# Patient Record
Sex: Male | Born: 1935 | Race: White | Hispanic: No | Smoking: Former smoker
Health system: Southern US, Community
[De-identification: ages and names within clinical notes are randomized; demographics above are authoritative.]

## PROBLEM LIST (undated history)

## (undated) DIAGNOSIS — E785 Hyperlipidemia, unspecified: Secondary | ICD-10-CM

## (undated) HISTORY — PX: HERNIA REPAIR: SHX51

---

## 2000-04-27 ENCOUNTER — Ambulatory Visit (HOSPITAL_COMMUNITY): Admission: RE | Admit: 2000-04-27 | Discharge: 2000-04-27 | Payer: Self-pay | Admitting: Ophthalmology

## 2006-07-04 ENCOUNTER — Ambulatory Visit: Payer: Self-pay | Admitting: Internal Medicine

## 2006-07-18 ENCOUNTER — Ambulatory Visit: Payer: Self-pay | Admitting: Internal Medicine

## 2011-10-11 DIAGNOSIS — Z125 Encounter for screening for malignant neoplasm of prostate: Secondary | ICD-10-CM | POA: Diagnosis not present

## 2011-10-11 DIAGNOSIS — I10 Essential (primary) hypertension: Secondary | ICD-10-CM | POA: Diagnosis not present

## 2011-10-11 DIAGNOSIS — R7301 Impaired fasting glucose: Secondary | ICD-10-CM | POA: Diagnosis not present

## 2011-10-11 DIAGNOSIS — R82998 Other abnormal findings in urine: Secondary | ICD-10-CM | POA: Diagnosis not present

## 2011-10-11 DIAGNOSIS — G250 Essential tremor: Secondary | ICD-10-CM | POA: Diagnosis not present

## 2011-10-18 DIAGNOSIS — I10 Essential (primary) hypertension: Secondary | ICD-10-CM | POA: Diagnosis not present

## 2011-10-18 DIAGNOSIS — Z0000 Encounter for general adult medical examination without abnormal findings: Secondary | ICD-10-CM | POA: Diagnosis not present

## 2011-10-18 DIAGNOSIS — J449 Chronic obstructive pulmonary disease, unspecified: Secondary | ICD-10-CM | POA: Diagnosis not present

## 2011-10-18 DIAGNOSIS — Z125 Encounter for screening for malignant neoplasm of prostate: Secondary | ICD-10-CM | POA: Diagnosis not present

## 2011-10-19 DIAGNOSIS — Z1212 Encounter for screening for malignant neoplasm of rectum: Secondary | ICD-10-CM | POA: Diagnosis not present

## 2011-12-26 DIAGNOSIS — H35059 Retinal neovascularization, unspecified, unspecified eye: Secondary | ICD-10-CM | POA: Diagnosis not present

## 2011-12-26 DIAGNOSIS — H3530 Unspecified macular degeneration: Secondary | ICD-10-CM | POA: Diagnosis not present

## 2011-12-26 DIAGNOSIS — H353290 Exudative age-related macular degeneration, unspecified eye, stage unspecified: Secondary | ICD-10-CM | POA: Diagnosis not present

## 2012-02-05 DIAGNOSIS — J449 Chronic obstructive pulmonary disease, unspecified: Secondary | ICD-10-CM | POA: Diagnosis not present

## 2012-02-05 DIAGNOSIS — R030 Elevated blood-pressure reading, without diagnosis of hypertension: Secondary | ICD-10-CM | POA: Diagnosis not present

## 2012-02-05 DIAGNOSIS — R05 Cough: Secondary | ICD-10-CM | POA: Diagnosis not present

## 2012-03-21 DIAGNOSIS — H3530 Unspecified macular degeneration: Secondary | ICD-10-CM | POA: Diagnosis not present

## 2012-03-21 DIAGNOSIS — H35059 Retinal neovascularization, unspecified, unspecified eye: Secondary | ICD-10-CM | POA: Diagnosis not present

## 2012-03-21 DIAGNOSIS — H3320 Serous retinal detachment, unspecified eye: Secondary | ICD-10-CM | POA: Diagnosis not present

## 2012-03-21 DIAGNOSIS — H33009 Unspecified retinal detachment with retinal break, unspecified eye: Secondary | ICD-10-CM | POA: Diagnosis not present

## 2012-03-21 DIAGNOSIS — H353290 Exudative age-related macular degeneration, unspecified eye, stage unspecified: Secondary | ICD-10-CM | POA: Diagnosis not present

## 2012-03-26 DIAGNOSIS — H3530 Unspecified macular degeneration: Secondary | ICD-10-CM | POA: Diagnosis not present

## 2012-03-26 DIAGNOSIS — H3320 Serous retinal detachment, unspecified eye: Secondary | ICD-10-CM | POA: Diagnosis not present

## 2012-03-28 DIAGNOSIS — H3530 Unspecified macular degeneration: Secondary | ICD-10-CM | POA: Diagnosis not present

## 2012-03-28 DIAGNOSIS — H3320 Serous retinal detachment, unspecified eye: Secondary | ICD-10-CM | POA: Diagnosis not present

## 2012-06-15 ENCOUNTER — Encounter (HOSPITAL_COMMUNITY): Payer: Self-pay

## 2012-06-15 ENCOUNTER — Emergency Department (INDEPENDENT_AMBULATORY_CARE_PROVIDER_SITE_OTHER)
Admission: EM | Admit: 2012-06-15 | Discharge: 2012-06-15 | Disposition: A | Discharge status: Home / Self Care | Payer: Medicare Other | Source: Home / Self Care | Attending: Emergency Medicine | Admitting: Emergency Medicine

## 2012-06-15 DIAGNOSIS — M545 Low back pain: Secondary | ICD-10-CM

## 2012-06-15 LAB — POCT URINALYSIS DIP (DEVICE)
Glucose, UA: NEGATIVE mg/dL
Ketones, ur: NEGATIVE mg/dL
Leukocytes, UA: NEGATIVE
Protein, ur: NEGATIVE mg/dL
Urobilinogen, UA: 0.2 mg/dL (ref 0.0–1.0)

## 2012-06-15 MED ORDER — METHOCARBAMOL 500 MG PO TABS: 500.0000 mg | ORAL_TABLET | Freq: Three times a day (TID) | ORAL | Status: AC

## 2012-06-15 MED ORDER — TRAMADOL HCL 50 MG PO TABS: 100.0000 mg | ORAL_TABLET | Freq: Three times a day (TID) | ORAL | Status: AC | PRN

## 2012-06-15 NOTE — ED Provider Notes (Signed)
Chief Complaint  Patient presents with  . Back Pain    History of Present Illness:   [name] is an 78 year old male who has had a four-day history of lower back pain. This is bilateral and without radiation. He denies any injury to his back. It seems to be getting worse. It's worse with bending, twisting, with movement. He denies any radiation down the legs, numbness, tingling, or muscle weakness. He's had no bladder or bowel dysfunction. No abdominal pain, fever, chills, chest pain, shortness of breath, or dizziness. He's never had pain like this before. He called his primary care physician and the on-call doctor recommended he go to the emergency room to rule out an aneurysm. He came here instead.  Review of Systems:  Other than noted above, the patient denies any of the following symptoms: Systemic:  No fever, chills, severe fatigue, or unexplained weight loss. GI:  No abdominal pain, nausea, vomiting, diarrhea, constipation, incontinence of bowel, or blood in stool. GU:  No dysuria, frequency, urgency, or hematuria. No incontinence of urine or difficulty urinating.  M-S:  No neck pain, joint pain, arthritis, or myalgias. Neuro:  No paresthesias, saddle anesthesia, muscular weakness, or progressive neurological deficit.  PMFSH:  Past medical history, family history, social history, meds, and allergies were reviewed. Specifically, there is no history of cancer, major trauma, osteoporosis, immunosuppression, HIV, or IV or injection drug use.   Physical Exam:   Vital signs:  BP 225/92  Pulse 80  Temp 98.1 F (36.7 C) (Oral)  Resp 18  SpO2 96% General:  Alert, oriented, in no distress. Abdomen:  Soft, non-tender.  No organomegaly or mass.  No pulsatile midline abdominal mass or bruit. Back:  There was moderate pain to palpation in the lower back over the paravertebral muscles. His back had 60 of flexion with pain, 10 of extension, 10 of lateral bending, and 30 of rotation all with  pain. Straight leg raising was positive for the right leg and negative in the left. He has a negative Lasegue's sign and negative popliteal compression sign. Neuro:  Normal muscle strength, sensations and DTRs. Extremities: Pedal pulses were full, he has slight pedal edema.. Skin:  Clear, warm and dry.  No rash.  Labs:   Results for orders placed during the hospital encounter of 06/15/12  POCT URINALYSIS DIP (DEVICE)      Component Value Range   Glucose, UA NEGATIVE  NEGATIVE mg/dL   Bilirubin Urine NEGATIVE  NEGATIVE   Ketones, ur NEGATIVE  NEGATIVE mg/dL   Specific Gravity, Urine <=1.005  1.005 - 1.030   Hgb urine dipstick NEGATIVE  NEGATIVE   pH 5.5  5.0 - 8.0   Protein, ur NEGATIVE  NEGATIVE mg/dL   Urobilinogen, UA 0.2  0.0 - 1.0 mg/dL   Nitrite NEGATIVE  NEGATIVE   Leukocytes, UA NEGATIVE  NEGATIVE    Assessment:  The encounter diagnosis was Low back pain.  Plan:   1.  The following meds were prescribed:   New Prescriptions   METHOCARBAMOL (ROBAXIN) 500 MG TABLET    Take 1 tablet (500 mg total) by mouth 3 (three) times daily.   TRAMADOL (ULTRAM) 50 MG TABLET    Take 2 tablets (100 mg total) by mouth every 8 (eight) hours as needed for pain.   2.  The patient was instructed in symptomatic care and handouts were given. 3.  The patient was told to return if becoming worse in any way, if no better in 2 weeks, and  given some red flag symptoms that would indicate earlier return. 4.  The patient was encouraged to try to be as active as possible and given some exercises to do followed by moist heat. I told him that there was no tests that [facility] could [facility] that would rule out an aneurysm. I offered to have him transferred to the emergency department for further testing, but he declined. He was admonished that he he should get worse or the pain hadn't improved in 24 hours to return to the emergency room for recheck.    Reuben Likes, MD 06/15/12 2031

## 2012-06-15 NOTE — ED Notes (Signed)
Pt has low back pain that started on Wed, no known injury.  He played golf on Thursday and took hydrocodone and has gradually worsened.  Denies leg pain.

## 2012-06-17 DIAGNOSIS — M545 Low back pain: Secondary | ICD-10-CM | POA: Diagnosis not present

## 2012-06-21 ENCOUNTER — Ambulatory Visit (HOSPITAL_COMMUNITY)
Admission: RE | Admit: 2012-06-21 | Discharge: 2012-06-21 | Disposition: A | Discharge status: Home / Self Care | Payer: Medicare Other | Source: Ambulatory Visit | Attending: Orthopedic Surgery | Admitting: Orthopedic Surgery

## 2012-06-21 DIAGNOSIS — M6281 Muscle weakness (generalized): Secondary | ICD-10-CM | POA: Insufficient documentation

## 2012-06-21 DIAGNOSIS — IMO0001 Reserved for inherently not codable concepts without codable children: Secondary | ICD-10-CM | POA: Insufficient documentation

## 2012-06-21 DIAGNOSIS — R262 Difficulty in walking, not elsewhere classified: Secondary | ICD-10-CM | POA: Insufficient documentation

## 2012-06-21 DIAGNOSIS — M2560 Stiffness of unspecified joint, not elsewhere classified: Secondary | ICD-10-CM | POA: Insufficient documentation

## 2012-06-21 DIAGNOSIS — M542 Cervicalgia: Secondary | ICD-10-CM | POA: Diagnosis not present

## 2012-06-21 DIAGNOSIS — M549 Dorsalgia, unspecified: Secondary | ICD-10-CM | POA: Diagnosis not present

## 2012-06-21 NOTE — Evaluation (Signed)
Physical Therapy Evaluation  Patient Details  Name: [name] MRN: [id number] Date of Birth: 03/26/1924  Today's Date: 06/21/2012 Time: 1191-4782 PT Time Calculation (min): 47 min  Visit#: 1  of 6   Re-eval: 07/12/12 Assessment Diagnosis: Low back pain  Authorization: medicare  Authorization Time Period:    Authorization Visit#: 1  of 10    Past Medical History: No past medical history on file. Past Surgical History: No past surgical history on file.  Subjective Symptoms/Limitations Symptoms: [name] states that he has has low back pain for for about ten days.  The  pt states there was not injury or trauma.  He took tylenol and went on about his day.  The next day the pain was greater; he took some hydrocodone but the next day was even worse. His pain has been progrressive since then.  The patient states that the pain is more on the right side than the left.  The pain is staying in his lower back area.   How long can you sit comfortably?: The patient states that he is feels like he has to get up and move around after 15 minutes.  How long can you stand comfortably?: The patient states that he is able to stand but he favors his position. How long can you walk comfortably?: The patient states that he is able to walk five minutes  Pain Assessment Currently in Pain?: Yes Pain Score:   7 (with pain meds.) Pain Location: Back Pain Orientation: Lower Pain Type: Acute pain Pain Relieving Factors: pain meds.    Prior Functioning  Home Living Lives With: Spouse Home Access: Stairs to enter (5; pt states that this is difficult for the patient.) Prior Function Vocation: On disability Leisure: Hobbies-yes (Comment) Comments: golfing  Cognition/Observation Cognition Overall Cognitive Status: Appears within functional limits for tasks assessed  Sensation/Coordination/Flexibility/Functional Tests Functional Tests Functional Tests: back oswestry 62/100  Assessment Lumbar  AROM Lumbar Flexion: decreased 70 % Lumbar Extension: increases pain  decreased 80% Lumbar - Right Side Bend: increases pain decreased 60% Lumbar - Left Side Bend: decreased 50% Lumbar - Right Rotation: decreased 30% Lumbar - Left Rotation: decreased 30%  Exercise/Treatments Mobility/Balance  Posture/Postural Control Posture/Postural Control: Postural limitations Postural Limitations: increased kyphosis; decreased lordosis   Stretches Active Hamstring Stretch: 3 reps;30 seconds Single Knee to Chest Stretch: 5 reps;20 seconds Lower Trunk Rotation: 5 reps Supine Ab Set: 10 reps    Physical Therapy Assessment and Plan PT Assessment and Plan Clinical Impression Statement: Pt with signs of instability who will benefit from skilled therapy to incrase core strength and imporve flexibility to decrease pain Pt will benefit from skilled therapeutic intervention in order to improve on the following deficits: Decreased range of motion;Decreased strength;Difficulty walking;Impaired flexibility;Pain Rehab Potential: Good PT Frequency: Min 2X/week PT Duration:  (3 weeks) PT Treatment/Interventions: Therapeutic exercise;Modalities;Patient/family education;Therapeutic activities PT Plan: begin bent knee raise, bridges, hip isometric and clam next treatment as well as adding in massage, 3rd treatment begin  SL abduction; prone heel squeeze, SLR; 4th treatment begin T-band ex.     Goals Home Exercise Program Pt will Perform Home Exercise Program: Independently PT Short Term Goals Time to Complete Short Term Goals: 2 weeks PT Short Term Goal 1: Pt pain to be decreased by 3 levels PT Short Term Goal 2: Pt ROM to be improved by 50% PT Long Term Goals Time to Complete Long Term Goals:  (3 weeks) PT Long Term Goal 1: I in advance HEP PT  Long Term Goal 2: Pain to be no greater than a 2 80% of the day without pain meds Long Term Goal 3: ROM wnl Long Term Goal 4: Pt oswestry to decrase by at least  10 points PT Long Term Goal 5: Pt to begin golfing at range  Problem List Patient Active Problem List  Diagnosis  . Difficulty in walking  . Stiffness of vertebral column    PT - End of Session Activity Tolerance: Patient tolerated treatment well General Behavior During Session: [facility] for tasks performed Cognition: [facility] for tasks performed PT Plan of Care PT Home Exercise Plan: given Consulted and Agree with Plan of Care: Patient  GP Functional Assessment Tool Used: LEFS Functional Limitation: Mobility: Walking and moving around Mobility: Walking and Moving Around Current Status ([id number]): At least 60 percent but less than 80 percent impaired, limited or restricted Mobility: Walking and Moving Around Goal Status [phone]): At least 1 percent but less than 20 percent impaired, limited or restricted  Diaz,Nicholas 06/21/2012, 10:47 AM  Physician Documentation Your signature is required to indicate approval of the treatment plan as stated above.  Please sign and either send electronically or make a copy of this report for your files and return this physician signed original.   Please mark one 1.__approve of plan  2. ___approve of plan with the following conditions.   ______________________________                                                          _____________________ Physician Signature                                                                                                             Date

## 2012-06-25 ENCOUNTER — Ambulatory Visit (HOSPITAL_COMMUNITY)
Admission: RE | Admit: 2012-06-25 | Discharge: 2012-06-25 | Disposition: A | Discharge status: Home / Self Care | Payer: Medicare Other | Source: Ambulatory Visit | Attending: Internal Medicine | Admitting: Internal Medicine

## 2012-06-25 DIAGNOSIS — M549 Dorsalgia, unspecified: Secondary | ICD-10-CM | POA: Diagnosis not present

## 2012-06-25 DIAGNOSIS — M542 Cervicalgia: Secondary | ICD-10-CM | POA: Diagnosis not present

## 2012-06-25 DIAGNOSIS — M6281 Muscle weakness (generalized): Secondary | ICD-10-CM | POA: Diagnosis not present

## 2012-06-25 DIAGNOSIS — IMO0001 Reserved for inherently not codable concepts without codable children: Secondary | ICD-10-CM | POA: Diagnosis not present

## 2012-06-25 NOTE — Progress Notes (Signed)
Physical Therapy Treatment Patient Details  Name: Nicholas Diaz MRN: [id number] Date of Birth: [birth year]  Today's Date: 06/25/2012 Time: 1478-2956 PT Time Calculation (min): 46 min  Visit#: 2  of 6   Re-eval: 07/12/12 Charges: Therex x 34' Manual x 10'  Authorization: medicare  Authorization Visit#: 2  of 10    Subjective: Symptoms/Limitations Symptoms: Pt states that his pain is gradually getting better. Pain Assessment Currently in Pain?: Yes Pain Score:   5 Pain Location: Back Pain Orientation: Lower   Exercise/Treatments Stretches Active Hamstring Stretch: 3 reps;30 seconds Single Knee to Chest Stretch: 3 reps;30 seconds Lower Trunk Rotation: 5 reps;10 seconds    Supine Ab Set: 10 reps Bent Knee Raise: 5 reps Bridge: 10 reps Straight Leg Raise: 5 reps Sidelying Clam: 5 reps;Limitations Clam Limitations: 10" holds  Manual Therapy Manual Therapy: Massage Massage: STM completed to B lower lumbar mm to decrease tightness/pain x10'  Physical Therapy Assessment and Plan PT Assessment and Plan Clinical Impression Statement: Pt completes therex well with minimal need for cueing after initial instruction. Pt does require multimodal cueing to avoid hamstring contraction with SL clams. STM completed to B lower lumbar mm to decrease tightness/pain. Pt reports pain decrease to 2/10 at end of session. PT Duration:  (3 weeks) PT Plan: Continue to progress per PT POC. Begin SL abduction, prone heel squeeze, and prone SLR next session.     Problem List Patient Active Problem List  Diagnosis  . Difficulty in walking  . Stiffness of vertebral column    PT - End of Session Activity Tolerance: Patient tolerated treatment well General Behavior During Session: [facility] for tasks performed Cognition: [facility] for tasks performed PT Plan of Care PT Patient Instructions: Pt reports that he has used dry heat at home and it has not helped his pain. Pt educated on the benefits of  moist heat and instructed to try this at home.   GP Functional Assessment Tool Used: LEFS  Seth Bake, PTA 06/25/2012, 9:09 AM

## 2012-06-27 ENCOUNTER — Ambulatory Visit (HOSPITAL_COMMUNITY)
Admission: RE | Admit: 2012-06-27 | Discharge: 2012-06-27 | Disposition: A | Discharge status: Home / Self Care | Payer: Medicare Other | Source: Ambulatory Visit | Attending: Internal Medicine | Admitting: Internal Medicine

## 2012-06-27 DIAGNOSIS — M2560 Stiffness of unspecified joint, not elsewhere classified: Secondary | ICD-10-CM

## 2012-06-27 DIAGNOSIS — M6281 Muscle weakness (generalized): Secondary | ICD-10-CM | POA: Diagnosis not present

## 2012-06-27 DIAGNOSIS — M542 Cervicalgia: Secondary | ICD-10-CM | POA: Diagnosis not present

## 2012-06-27 DIAGNOSIS — R262 Difficulty in walking, not elsewhere classified: Secondary | ICD-10-CM

## 2012-06-27 DIAGNOSIS — IMO0001 Reserved for inherently not codable concepts without codable children: Secondary | ICD-10-CM | POA: Diagnosis not present

## 2012-06-27 DIAGNOSIS — M549 Dorsalgia, unspecified: Secondary | ICD-10-CM | POA: Diagnosis not present

## 2012-06-27 NOTE — Progress Notes (Signed)
Physical Therapy Treatment Patient Details  Name: JOHNATTAN STRASSMAN MRN: [id number] Date of Birth: 1924-02-18  Today's Date: 06/27/2012 Time: 4098-1191 PT Time Calculation (min): 44 min  Visit#: 3  of 6   Re-eval: 07/12/12    Authorization: medicare  Authorization Time Period:    Authorization Visit#: 3  of 10    Subjective: Symptoms/Limitations Symptoms: Pt states he felt great after last treatment.  States pain is no longer constant    Exercise/Treatments     Stretches Active Hamstring Stretch: 3 reps;30 seconds Single Knee to Chest Stretch: 3 reps;30 seconds Lower Trunk Rotation: 5 reps;10 seconds   Supine Ab Set: 10 reps Sidelying Hip Abduction: 10 reps Prone  Straight Leg Raise: 10 reps Other Prone Lumbar Exercises: Heelsqueeze x 10     Manual Therapy Manual Therapy: Massage Massage: STM to lumbar mm x  Physical Therapy Assessment and Plan PT Assessment and Plan Clinical Impression Statement: Pt had good form with new ex; vc to keep hip/shld aligned with SL activity.  Pain down to 0/10 at end of session Rehab Potential: Good Clinical Impairments Affecting Rehab Potential: begin T-band ex next rx    Goals    Problem List Patient Active Problem List  Diagnosis  . Difficulty in walking  . Stiffness of vertebral column    PT - End of Session Activity Tolerance: Patient tolerated treatment well PT Plan of Care PT Home Exercise Plan: given ; pt given list of massage therapist this session  GP    Kambria Grima,CINDY 06/27/2012, 8:59 AM

## 2012-07-02 ENCOUNTER — Ambulatory Visit (HOSPITAL_COMMUNITY)
Admission: RE | Admit: 2012-07-02 | Discharge: 2012-07-02 | Disposition: A | Discharge status: Home / Self Care | Payer: Medicare Other | Source: Ambulatory Visit | Attending: Internal Medicine | Admitting: Internal Medicine

## 2012-07-02 DIAGNOSIS — M549 Dorsalgia, unspecified: Secondary | ICD-10-CM | POA: Diagnosis not present

## 2012-07-02 DIAGNOSIS — IMO0001 Reserved for inherently not codable concepts without codable children: Secondary | ICD-10-CM | POA: Diagnosis not present

## 2012-07-02 DIAGNOSIS — M6281 Muscle weakness (generalized): Secondary | ICD-10-CM | POA: Diagnosis not present

## 2012-07-02 DIAGNOSIS — M542 Cervicalgia: Secondary | ICD-10-CM | POA: Diagnosis not present

## 2012-07-02 NOTE — Progress Notes (Signed)
Physical Therapy Treatment Patient Details  Name: Nicholas Diaz MRN: [id number] Date of Birth: 26-Mar-1924  Today's Date: 07/02/2012 Time: 0805-0850 PT Time Calculation (min): 45 min  Visit#: 4  of 6   Re-eval: 07/12/12 Charges: Therex x 30' Manual x 10'  Authorization: Medicare  Authorization Visit#: 4  of 10    Subjective: Symptoms/Limitations Symptoms: I'm feeling better. Pain Assessment Currently in Pain?: No/denies Pain Score: 0-No pain   Exercise/Treatments Stretches Active Hamstring Stretch: 1 rep;30 seconds (HEP) Single Knee to Chest Stretch:  (HEP) Lower Trunk Rotation:  (HEP) Standing Scapular Retraction: 10 reps;Theraband Theraband Level (Scapular Retraction): Level 3 (Green) Row: 10 reps;Theraband Theraband Level (Row): Level 3 (Green) Shoulder Extension: 10 reps;Theraband Theraband Level (Shoulder Extension): Level 3 (Green) Supine Ab Set: 15 reps;5 seconds Bent Knee Raise: 10 reps Bridge: 15 reps Straight Leg Raise: 10 reps Sidelying Clam: 5 reps;Limitations Clam Limitations: 10" holds Hip Abduction: 15 reps Prone  Straight Leg Raise: 10 reps Other Prone Lumbar Exercises: Heelsqueeze x 10  Manual Therapy Manual Therapy: Massage Massage: STM to lumbar mm x 10'  Physical Therapy Assessment and Plan PT Assessment and Plan Clinical Impression Statement: Pt completes stabilization exercises well with good core control. Began tband therex with proper form after demo and multimodal cueing. Pt appears to complete therex with increased ease. Pt reports 1/10 LBP after therex. Massage completed to decrease pain/tightness in lower back area. Pt reports 0/10 pain at end of session. PT Plan: Continue to progress core strength and decrease LBP/tightness per PT POC.     Problem List Patient Active Problem List  Diagnosis  . Difficulty in walking  . Stiffness of vertebral column    PT - End of Session Activity Tolerance: Patient tolerated treatment  well General Behavior During Session: [facility] for tasks performed Cognition: [facility] for tasks performed   Seth Bake, PTA 07/02/2012, 9:04 AM

## 2012-07-04 ENCOUNTER — Ambulatory Visit (HOSPITAL_COMMUNITY)
Admission: RE | Admit: 2012-07-04 | Discharge: 2012-07-04 | Disposition: A | Discharge status: Home / Self Care | Payer: Medicare Other | Source: Ambulatory Visit | Attending: Internal Medicine | Admitting: Internal Medicine

## 2012-07-04 DIAGNOSIS — M2560 Stiffness of unspecified joint, not elsewhere classified: Secondary | ICD-10-CM

## 2012-07-04 DIAGNOSIS — M549 Dorsalgia, unspecified: Secondary | ICD-10-CM | POA: Diagnosis not present

## 2012-07-04 DIAGNOSIS — M542 Cervicalgia: Secondary | ICD-10-CM | POA: Diagnosis not present

## 2012-07-04 DIAGNOSIS — R262 Difficulty in walking, not elsewhere classified: Secondary | ICD-10-CM

## 2012-07-04 DIAGNOSIS — IMO0001 Reserved for inherently not codable concepts without codable children: Secondary | ICD-10-CM | POA: Diagnosis not present

## 2012-07-04 DIAGNOSIS — M6281 Muscle weakness (generalized): Secondary | ICD-10-CM | POA: Diagnosis not present

## 2012-07-04 NOTE — Progress Notes (Signed)
Physical Therapy Treatment Patient Details  Name: Nicholas Diaz MRN: [id number] Date of Birth: Feb 25, 1924  Today's Date: 07/04/2012 Time: 0800-0847 PT Time Calculation (min): 47 min  Visit#: 5  of 6   Re-eval: 07/09/12    Authorization: Medicare      Authorization Visit#: 5  of 6    Subjective: Symptoms/Limitations Symptoms: Doing better but has not played golf  Pain Assessment Pain Score:   1 Pain Location: Back  Exercise/Treatments      Stretches Active Hamstring Stretch: 1 rep;30 seconds (HEP) Single Knee to Chest Stretch:  (HEP) Lower Trunk Rotation:  (HEP) Standing Scapular Retraction: 10 reps;Theraband Theraband Level (Scapular Retraction): Level 3 (Green) Row: 10 reps;Theraband Theraband Level (Row): Level 3 (Green) Shoulder Extension: 10 reps;Theraband Theraband Level (Shoulder Extension): Level 3 (Green)    Supine Dead Bug: 10 reps Bridge: 15 reps Sidelying Clam: 5 reps;Limitations Clam Limitations: 10" holds Hip Abduction: 15 reps Prone  Straight Leg Raise: 10 reps Opposite Arm/Leg Raise: 10 reps Other Prone Lumbar Exercises: Heelsqueeze x 10     Physical Therapy Assessment and Plan PT Assessment and Plan Clinical Impression Statement: Pt given T band for home use; added new ex with good form.   PT Plan: reassess next treatment.  Pt to play golf over the weekend  Goals    Problem List Patient Active Problem List  Diagnosis  . Difficulty in walking  . Stiffness of vertebral column    PT - End of Session Activity Tolerance: Patient tolerated treatment well General Behavior During Session: [facility] for tasks performed Cognition: [facility] for tasks performed  GP    RUSSELL,CINDY 07/04/2012, 8:40 AM

## 2012-07-09 ENCOUNTER — Ambulatory Visit (HOSPITAL_COMMUNITY)
Admission: RE | Admit: 2012-07-09 | Discharge: 2012-07-09 | Disposition: A | Discharge status: Home / Self Care | Payer: Medicare Other | Source: Ambulatory Visit | Attending: Orthopedic Surgery | Admitting: Orthopedic Surgery

## 2012-07-09 DIAGNOSIS — Z23 Encounter for immunization: Secondary | ICD-10-CM | POA: Diagnosis not present

## 2012-07-09 DIAGNOSIS — IMO0001 Reserved for inherently not codable concepts without codable children: Secondary | ICD-10-CM | POA: Diagnosis not present

## 2012-07-09 DIAGNOSIS — M542 Cervicalgia: Secondary | ICD-10-CM | POA: Insufficient documentation

## 2012-07-09 DIAGNOSIS — M549 Dorsalgia, unspecified: Secondary | ICD-10-CM | POA: Insufficient documentation

## 2012-07-09 DIAGNOSIS — M6281 Muscle weakness (generalized): Secondary | ICD-10-CM | POA: Insufficient documentation

## 2012-07-09 NOTE — Progress Notes (Signed)
Physical Therapy Re-evaluation / Discharge    Name: Nicholas Diaz MRN: [id number] Date of Birth: 01-Oct-1924  Today's Date: 07/09/2012 Time: 4098-1191 PT Time Calculation (min): 39 min  Visit#: 6  of 6   Re-eval:   Diagnosis: Low back pain Next MD Visit: unknown Charges:  massage 12', therex 10', PPT 15' Authorization: Medicare  Authorization Time Period:    Authorization Visit#: 6  of 6    Subjective Symptoms/Limitations Symptoms: Pt. states he had no diffiucultyj playing golf over the weekend. States he really hasn't had pain in a while and is much better. Still has a discomfort every now and then but not frequently.  Reports compliance with HEP 3X daily and has no questions regarding his HEP. Pain Assessment Currently in Pain?: No/denies  Sensation/Coordination/Flexibility/Functional Tests Functional Tests: Oswestry for LBP now 24% (was 62%)  Assessment Lumbar AROM Lumbar Flexion: WNL (was decreased 70%) Lumbar Extension: WNL (was decreased 80% with pain) Lumbar - Right Side Bend: WNL (was decreased 60% with pain) Lumbar - Left Side Bend: WNL (was decreased 50%) Lumbar - Right Rotation: WNL (was decreased 30%) Lumbar - Left Rotation: WNL (was decreased 30%)  Exercise/Treatments Mobility/Balance  Posture/Postural Control Posture/Postural Control: Postural limitations Postural Limitations: increased kyphosis; decreased lordosis  Stretches Single Knee to Chest Stretch: 2 reps;30 seconds;Limitations Single Knee to Chest Stretch Limitations: HEP Lower Trunk Rotation: 5 reps;10 seconds;Limitations (HEP) Lower Trunk Rotation Limitations: HEP Sidelying Clam: 5 reps;Limitations Clam Limitations: 10" holds Hip Abduction: 15 reps   Manual Therapy Manual Therapy: Massage Massage: STM to lumbar mm x 10'   Physical Therapy Assessment and Plan PT Assessment and Plan Clinical Impression Statement: Pt. able to demonstrate exercises correctly without questions regarding  HEP.  Lumbar ROM is now WNL without pain and overall improvement in Oswestry LBP scale from 62% to 24%.  Pt. has met all goals and is agreeable to discharge to HEP. PT Plan: Discharge to HEP.    Goals Home Exercise Program Pt will Perform Home Exercise Program: Independently PT Goal: Perform Home Exercise Program - Progress: Met  PT Short Term Goals Time to Complete Short Term Goals: 2 weeks PT Short Term Goal 1: Pt pain to be decreased by 3 levels PT Short Term Goal 1 - Progress: Met PT Short Term Goal 2: Pt ROM to be improved by 50% PT Short Term Goal 2 - Progress: Met  PT Long Term Goals Time to Complete Long Term Goals:  (3 weeks) PT Long Term Goal 1: I in advance HEP PT Long Term Goal 1 - Progress: Met PT Long Term Goal 2: Pain to be no greater than a 2 80% of the day without pain meds PT Long Term Goal 2 - Progress: Met Long Term Goal 3: ROM wnl Long Term Goal 3 Progress: Met Long Term Goal 4: Pt oswestry to decrase by at least 10 points Long Term Goal 4 Progress: Met PT Long Term Goal 5: Pt to begin golfing at range  Problem List Patient Active Problem List  Diagnosis  . Difficulty in walking  . Stiffness of vertebral column   GP Functional Assessment Tool Used: LEFS Functional Limitation: Mobility: Walking and moving around Mobility: Walking and Moving Around Current Status (Y7829): At least 1 percent but less than 20 percent impaired, limited or restricted Mobility: Walking and Moving Around Goal Status [phone]): At least 1 percent but less than 20 percent impaired, limited or restricted Mobility: Walking and Moving Around Discharge Status [phone]): At least 1 percent but  less than 20 percent impaired, limited or restricted  Lurena Nida, PTA/CLT 07/09/2012, 11:08 AM

## 2012-07-11 ENCOUNTER — Ambulatory Visit (HOSPITAL_COMMUNITY): Payer: Medicare Other | Admitting: *Deleted

## 2012-07-16 ENCOUNTER — Ambulatory Visit (HOSPITAL_COMMUNITY): Payer: Medicare Other | Admitting: *Deleted

## 2012-07-18 ENCOUNTER — Ambulatory Visit (HOSPITAL_COMMUNITY): Payer: Medicare Other | Admitting: *Deleted

## 2012-08-13 DIAGNOSIS — H3530 Unspecified macular degeneration: Secondary | ICD-10-CM | POA: Diagnosis not present

## 2012-08-13 DIAGNOSIS — H35059 Retinal neovascularization, unspecified, unspecified eye: Secondary | ICD-10-CM | POA: Diagnosis not present

## 2012-08-13 DIAGNOSIS — H3320 Serous retinal detachment, unspecified eye: Secondary | ICD-10-CM | POA: Diagnosis not present

## 2012-08-13 DIAGNOSIS — H43819 Vitreous degeneration, unspecified eye: Secondary | ICD-10-CM | POA: Diagnosis not present

## 2012-12-25 DIAGNOSIS — I10 Essential (primary) hypertension: Secondary | ICD-10-CM | POA: Diagnosis not present

## 2012-12-25 DIAGNOSIS — Z125 Encounter for screening for malignant neoplasm of prostate: Secondary | ICD-10-CM | POA: Diagnosis not present

## 2012-12-25 DIAGNOSIS — R7301 Impaired fasting glucose: Secondary | ICD-10-CM | POA: Diagnosis not present

## 2012-12-25 DIAGNOSIS — R809 Proteinuria, unspecified: Secondary | ICD-10-CM | POA: Diagnosis not present

## 2013-01-01 DIAGNOSIS — Z125 Encounter for screening for malignant neoplasm of prostate: Secondary | ICD-10-CM | POA: Diagnosis not present

## 2013-01-01 DIAGNOSIS — M1710 Unilateral primary osteoarthritis, unspecified knee: Secondary | ICD-10-CM | POA: Diagnosis not present

## 2013-01-01 DIAGNOSIS — J449 Chronic obstructive pulmonary disease, unspecified: Secondary | ICD-10-CM | POA: Diagnosis not present

## 2013-01-01 DIAGNOSIS — I10 Essential (primary) hypertension: Secondary | ICD-10-CM | POA: Diagnosis not present

## 2013-01-01 DIAGNOSIS — R030 Elevated blood-pressure reading, without diagnosis of hypertension: Secondary | ICD-10-CM | POA: Diagnosis not present

## 2013-01-01 DIAGNOSIS — Z1331 Encounter for screening for depression: Secondary | ICD-10-CM | POA: Diagnosis not present

## 2013-01-01 DIAGNOSIS — M549 Dorsalgia, unspecified: Secondary | ICD-10-CM | POA: Diagnosis not present

## 2013-01-01 DIAGNOSIS — Z23 Encounter for immunization: Secondary | ICD-10-CM | POA: Diagnosis not present

## 2013-01-01 DIAGNOSIS — R809 Proteinuria, unspecified: Secondary | ICD-10-CM | POA: Diagnosis not present

## 2013-01-01 DIAGNOSIS — I440 Atrioventricular block, first degree: Secondary | ICD-10-CM | POA: Diagnosis not present

## 2013-01-01 DIAGNOSIS — Z0000 Encounter for general adult medical examination without abnormal findings: Secondary | ICD-10-CM | POA: Diagnosis not present

## 2013-01-01 DIAGNOSIS — R609 Edema, unspecified: Secondary | ICD-10-CM | POA: Diagnosis not present

## 2013-01-07 DIAGNOSIS — Z1212 Encounter for screening for malignant neoplasm of rectum: Secondary | ICD-10-CM | POA: Diagnosis not present

## 2013-03-11 DIAGNOSIS — H3320 Serous retinal detachment, unspecified eye: Secondary | ICD-10-CM | POA: Diagnosis not present

## 2013-03-11 DIAGNOSIS — H35059 Retinal neovascularization, unspecified, unspecified eye: Secondary | ICD-10-CM | POA: Diagnosis not present

## 2013-03-11 DIAGNOSIS — H3530 Unspecified macular degeneration: Secondary | ICD-10-CM | POA: Diagnosis not present

## 2013-03-11 DIAGNOSIS — H43819 Vitreous degeneration, unspecified eye: Secondary | ICD-10-CM | POA: Diagnosis not present

## 2013-03-18 DIAGNOSIS — H353290 Exudative age-related macular degeneration, unspecified eye, stage unspecified: Secondary | ICD-10-CM | POA: Diagnosis not present

## 2013-04-22 DIAGNOSIS — H43819 Vitreous degeneration, unspecified eye: Secondary | ICD-10-CM | POA: Diagnosis not present

## 2013-04-22 DIAGNOSIS — H353290 Exudative age-related macular degeneration, unspecified eye, stage unspecified: Secondary | ICD-10-CM | POA: Diagnosis not present

## 2013-04-22 DIAGNOSIS — H35059 Retinal neovascularization, unspecified, unspecified eye: Secondary | ICD-10-CM | POA: Diagnosis not present

## 2013-04-22 DIAGNOSIS — H3320 Serous retinal detachment, unspecified eye: Secondary | ICD-10-CM | POA: Diagnosis not present

## 2013-04-28 DIAGNOSIS — H35059 Retinal neovascularization, unspecified, unspecified eye: Secondary | ICD-10-CM | POA: Diagnosis not present

## 2013-04-28 DIAGNOSIS — H43819 Vitreous degeneration, unspecified eye: Secondary | ICD-10-CM | POA: Diagnosis not present

## 2013-04-28 DIAGNOSIS — Z006 Encounter for examination for normal comparison and control in clinical research program: Secondary | ICD-10-CM | POA: Diagnosis not present

## 2013-04-28 DIAGNOSIS — H3530 Unspecified macular degeneration: Secondary | ICD-10-CM | POA: Diagnosis not present

## 2013-04-28 DIAGNOSIS — H3320 Serous retinal detachment, unspecified eye: Secondary | ICD-10-CM | POA: Diagnosis not present

## 2013-05-05 DIAGNOSIS — H35059 Retinal neovascularization, unspecified, unspecified eye: Secondary | ICD-10-CM | POA: Diagnosis not present

## 2013-05-05 DIAGNOSIS — H353290 Exudative age-related macular degeneration, unspecified eye, stage unspecified: Secondary | ICD-10-CM | POA: Diagnosis not present

## 2013-05-05 DIAGNOSIS — H3320 Serous retinal detachment, unspecified eye: Secondary | ICD-10-CM | POA: Diagnosis not present

## 2013-05-05 DIAGNOSIS — H43819 Vitreous degeneration, unspecified eye: Secondary | ICD-10-CM | POA: Diagnosis not present

## 2013-05-05 DIAGNOSIS — H3530 Unspecified macular degeneration: Secondary | ICD-10-CM | POA: Diagnosis not present

## 2013-06-10 DIAGNOSIS — H43819 Vitreous degeneration, unspecified eye: Secondary | ICD-10-CM | POA: Diagnosis not present

## 2013-06-10 DIAGNOSIS — H3320 Serous retinal detachment, unspecified eye: Secondary | ICD-10-CM | POA: Diagnosis not present

## 2013-06-10 DIAGNOSIS — H35059 Retinal neovascularization, unspecified, unspecified eye: Secondary | ICD-10-CM | POA: Diagnosis not present

## 2013-06-10 DIAGNOSIS — H353290 Exudative age-related macular degeneration, unspecified eye, stage unspecified: Secondary | ICD-10-CM | POA: Diagnosis not present

## 2013-06-27 DIAGNOSIS — Z23 Encounter for immunization: Secondary | ICD-10-CM | POA: Diagnosis not present

## 2013-06-30 DIAGNOSIS — H3530 Unspecified macular degeneration: Secondary | ICD-10-CM | POA: Diagnosis not present

## 2013-06-30 DIAGNOSIS — Z006 Encounter for examination for normal comparison and control in clinical research program: Secondary | ICD-10-CM | POA: Diagnosis not present

## 2013-07-08 DIAGNOSIS — H43819 Vitreous degeneration, unspecified eye: Secondary | ICD-10-CM | POA: Diagnosis not present

## 2013-07-08 DIAGNOSIS — H3320 Serous retinal detachment, unspecified eye: Secondary | ICD-10-CM | POA: Diagnosis not present

## 2013-07-08 DIAGNOSIS — H35059 Retinal neovascularization, unspecified, unspecified eye: Secondary | ICD-10-CM | POA: Diagnosis not present

## 2013-07-08 DIAGNOSIS — H3530 Unspecified macular degeneration: Secondary | ICD-10-CM | POA: Diagnosis not present

## 2013-07-31 DIAGNOSIS — H3530 Unspecified macular degeneration: Secondary | ICD-10-CM | POA: Diagnosis not present

## 2013-07-31 DIAGNOSIS — H43819 Vitreous degeneration, unspecified eye: Secondary | ICD-10-CM | POA: Diagnosis not present

## 2013-07-31 DIAGNOSIS — H35059 Retinal neovascularization, unspecified, unspecified eye: Secondary | ICD-10-CM | POA: Diagnosis not present

## 2013-07-31 DIAGNOSIS — H3320 Serous retinal detachment, unspecified eye: Secondary | ICD-10-CM | POA: Diagnosis not present

## 2013-08-29 DIAGNOSIS — H35059 Retinal neovascularization, unspecified, unspecified eye: Secondary | ICD-10-CM | POA: Diagnosis not present

## 2013-08-29 DIAGNOSIS — H3320 Serous retinal detachment, unspecified eye: Secondary | ICD-10-CM | POA: Diagnosis not present

## 2013-08-29 DIAGNOSIS — H43819 Vitreous degeneration, unspecified eye: Secondary | ICD-10-CM | POA: Diagnosis not present

## 2013-08-29 DIAGNOSIS — H353290 Exudative age-related macular degeneration, unspecified eye, stage unspecified: Secondary | ICD-10-CM | POA: Diagnosis not present

## 2013-09-08 DIAGNOSIS — H3530 Unspecified macular degeneration: Secondary | ICD-10-CM | POA: Diagnosis not present

## 2013-10-06 DIAGNOSIS — H3320 Serous retinal detachment, unspecified eye: Secondary | ICD-10-CM | POA: Diagnosis not present

## 2013-10-06 DIAGNOSIS — Z006 Encounter for examination for normal comparison and control in clinical research program: Secondary | ICD-10-CM | POA: Diagnosis not present

## 2013-10-06 DIAGNOSIS — H35059 Retinal neovascularization, unspecified, unspecified eye: Secondary | ICD-10-CM | POA: Diagnosis not present

## 2013-10-06 DIAGNOSIS — H3530 Unspecified macular degeneration: Secondary | ICD-10-CM | POA: Diagnosis not present

## 2013-11-10 DIAGNOSIS — H3320 Serous retinal detachment, unspecified eye: Secondary | ICD-10-CM | POA: Diagnosis not present

## 2013-11-10 DIAGNOSIS — H35059 Retinal neovascularization, unspecified, unspecified eye: Secondary | ICD-10-CM | POA: Diagnosis not present

## 2013-11-10 DIAGNOSIS — H3530 Unspecified macular degeneration: Secondary | ICD-10-CM | POA: Diagnosis not present

## 2013-11-10 DIAGNOSIS — Z006 Encounter for examination for normal comparison and control in clinical research program: Secondary | ICD-10-CM | POA: Diagnosis not present

## 2013-12-01 DIAGNOSIS — H353290 Exudative age-related macular degeneration, unspecified eye, stage unspecified: Secondary | ICD-10-CM | POA: Diagnosis not present

## 2013-12-01 DIAGNOSIS — Z006 Encounter for examination for normal comparison and control in clinical research program: Secondary | ICD-10-CM | POA: Diagnosis not present

## 2013-12-23 DIAGNOSIS — H3320 Serous retinal detachment, unspecified eye: Secondary | ICD-10-CM | POA: Diagnosis not present

## 2013-12-23 DIAGNOSIS — H35059 Retinal neovascularization, unspecified, unspecified eye: Secondary | ICD-10-CM | POA: Diagnosis not present

## 2013-12-23 DIAGNOSIS — H3530 Unspecified macular degeneration: Secondary | ICD-10-CM | POA: Diagnosis not present

## 2013-12-31 DIAGNOSIS — H3530 Unspecified macular degeneration: Secondary | ICD-10-CM | POA: Diagnosis not present

## 2013-12-31 DIAGNOSIS — Z006 Encounter for examination for normal comparison and control in clinical research program: Secondary | ICD-10-CM | POA: Diagnosis not present

## 2014-01-05 DIAGNOSIS — L280 Lichen simplex chronicus: Secondary | ICD-10-CM | POA: Diagnosis not present

## 2014-01-05 DIAGNOSIS — D485 Neoplasm of uncertain behavior of skin: Secondary | ICD-10-CM | POA: Diagnosis not present

## 2014-01-28 DIAGNOSIS — H35059 Retinal neovascularization, unspecified, unspecified eye: Secondary | ICD-10-CM | POA: Diagnosis not present

## 2014-01-28 DIAGNOSIS — H3530 Unspecified macular degeneration: Secondary | ICD-10-CM | POA: Diagnosis not present

## 2014-02-10 DIAGNOSIS — I10 Essential (primary) hypertension: Secondary | ICD-10-CM | POA: Diagnosis not present

## 2014-02-10 DIAGNOSIS — Z125 Encounter for screening for malignant neoplasm of prostate: Secondary | ICD-10-CM | POA: Diagnosis not present

## 2014-02-17 DIAGNOSIS — Z1212 Encounter for screening for malignant neoplasm of rectum: Secondary | ICD-10-CM | POA: Diagnosis not present

## 2014-02-19 ENCOUNTER — Other Ambulatory Visit (HOSPITAL_COMMUNITY): Payer: Self-pay | Admitting: Internal Medicine

## 2014-02-19 ENCOUNTER — Ambulatory Visit (HOSPITAL_COMMUNITY)
Admission: RE | Admit: 2014-02-19 | Discharge: 2014-02-19 | Disposition: A | Discharge status: Home / Self Care | Payer: Medicare Other | Source: Ambulatory Visit | Attending: Vascular Surgery | Admitting: Vascular Surgery

## 2014-02-19 DIAGNOSIS — R609 Edema, unspecified: Secondary | ICD-10-CM | POA: Diagnosis not present

## 2014-02-19 DIAGNOSIS — R809 Proteinuria, unspecified: Secondary | ICD-10-CM | POA: Diagnosis not present

## 2014-02-19 DIAGNOSIS — Z0000 Encounter for general adult medical examination without abnormal findings: Secondary | ICD-10-CM | POA: Diagnosis not present

## 2014-02-19 DIAGNOSIS — Z6831 Body mass index (BMI) 31.0-31.9, adult: Secondary | ICD-10-CM | POA: Diagnosis not present

## 2014-02-19 DIAGNOSIS — IMO0002 Reserved for concepts with insufficient information to code with codable children: Secondary | ICD-10-CM | POA: Diagnosis not present

## 2014-02-19 DIAGNOSIS — Z125 Encounter for screening for malignant neoplasm of prostate: Secondary | ICD-10-CM | POA: Diagnosis not present

## 2014-02-19 DIAGNOSIS — I10 Essential (primary) hypertension: Secondary | ICD-10-CM | POA: Diagnosis not present

## 2014-02-19 DIAGNOSIS — Z1331 Encounter for screening for depression: Secondary | ICD-10-CM | POA: Diagnosis not present

## 2014-02-19 DIAGNOSIS — M1710 Unilateral primary osteoarthritis, unspecified knee: Secondary | ICD-10-CM | POA: Diagnosis not present

## 2014-02-19 DIAGNOSIS — I440 Atrioventricular block, first degree: Secondary | ICD-10-CM | POA: Diagnosis not present

## 2014-02-19 NOTE — Progress Notes (Signed)
Preliminary results phoned to [facility] [place] [facility] and given to [facility].N.

## 2014-03-04 DIAGNOSIS — Z006 Encounter for examination for normal comparison and control in clinical research program: Secondary | ICD-10-CM | POA: Diagnosis not present

## 2014-03-04 DIAGNOSIS — H3530 Unspecified macular degeneration: Secondary | ICD-10-CM | POA: Diagnosis not present

## 2014-03-04 DIAGNOSIS — H35059 Retinal neovascularization, unspecified, unspecified eye: Secondary | ICD-10-CM | POA: Diagnosis not present

## 2014-03-04 DIAGNOSIS — H43819 Vitreous degeneration, unspecified eye: Secondary | ICD-10-CM | POA: Diagnosis not present

## 2014-03-04 DIAGNOSIS — H3320 Serous retinal detachment, unspecified eye: Secondary | ICD-10-CM | POA: Diagnosis not present

## 2014-03-27 DIAGNOSIS — H35059 Retinal neovascularization, unspecified, unspecified eye: Secondary | ICD-10-CM | POA: Diagnosis not present

## 2014-03-27 DIAGNOSIS — H3320 Serous retinal detachment, unspecified eye: Secondary | ICD-10-CM | POA: Diagnosis not present

## 2014-03-27 DIAGNOSIS — H353290 Exudative age-related macular degeneration, unspecified eye, stage unspecified: Secondary | ICD-10-CM | POA: Diagnosis not present

## 2014-03-27 DIAGNOSIS — H43819 Vitreous degeneration, unspecified eye: Secondary | ICD-10-CM | POA: Diagnosis not present

## 2014-04-01 DIAGNOSIS — H3530 Unspecified macular degeneration: Secondary | ICD-10-CM | POA: Diagnosis not present

## 2014-04-29 DIAGNOSIS — I440 Atrioventricular block, first degree: Secondary | ICD-10-CM | POA: Diagnosis not present

## 2014-05-29 DIAGNOSIS — H353290 Exudative age-related macular degeneration, unspecified eye, stage unspecified: Secondary | ICD-10-CM | POA: Diagnosis not present

## 2014-06-03 DIAGNOSIS — H35059 Retinal neovascularization, unspecified, unspecified eye: Secondary | ICD-10-CM | POA: Diagnosis not present

## 2014-06-03 DIAGNOSIS — H353290 Exudative age-related macular degeneration, unspecified eye, stage unspecified: Secondary | ICD-10-CM | POA: Diagnosis not present

## 2014-06-03 DIAGNOSIS — Z006 Encounter for examination for normal comparison and control in clinical research program: Secondary | ICD-10-CM | POA: Diagnosis not present

## 2014-06-03 DIAGNOSIS — H3320 Serous retinal detachment, unspecified eye: Secondary | ICD-10-CM | POA: Diagnosis not present

## 2014-07-02 DIAGNOSIS — Z23 Encounter for immunization: Secondary | ICD-10-CM | POA: Diagnosis not present

## 2014-07-10 DIAGNOSIS — H3532 Exudative age-related macular degeneration: Secondary | ICD-10-CM | POA: Diagnosis not present

## 2014-08-05 DIAGNOSIS — Z006 Encounter for examination for normal comparison and control in clinical research program: Secondary | ICD-10-CM | POA: Diagnosis not present

## 2014-08-05 DIAGNOSIS — H3532 Exudative age-related macular degeneration: Secondary | ICD-10-CM | POA: Diagnosis not present

## 2014-08-14 DIAGNOSIS — H3532 Exudative age-related macular degeneration: Secondary | ICD-10-CM | POA: Diagnosis not present

## 2014-09-04 DIAGNOSIS — Z6830 Body mass index (BMI) 30.0-30.9, adult: Secondary | ICD-10-CM | POA: Diagnosis not present

## 2014-09-04 DIAGNOSIS — R05 Cough: Secondary | ICD-10-CM | POA: Diagnosis not present

## 2014-09-04 DIAGNOSIS — R509 Fever, unspecified: Secondary | ICD-10-CM | POA: Diagnosis not present

## 2014-09-04 DIAGNOSIS — J189 Pneumonia, unspecified organism: Secondary | ICD-10-CM | POA: Diagnosis not present

## 2014-09-15 DIAGNOSIS — J189 Pneumonia, unspecified organism: Secondary | ICD-10-CM | POA: Diagnosis not present

## 2014-09-22 DIAGNOSIS — H3530 Unspecified macular degeneration: Secondary | ICD-10-CM | POA: Diagnosis not present

## 2014-11-26 DIAGNOSIS — Z961 Presence of intraocular lens: Secondary | ICD-10-CM | POA: Diagnosis not present

## 2014-11-26 DIAGNOSIS — H3532 Exudative age-related macular degeneration: Secondary | ICD-10-CM | POA: Diagnosis not present

## 2014-11-26 DIAGNOSIS — H35052 Retinal neovascularization, unspecified, left eye: Secondary | ICD-10-CM | POA: Diagnosis not present

## 2014-11-26 DIAGNOSIS — H3570 Unspecified separation of retinal layers: Secondary | ICD-10-CM | POA: Diagnosis not present

## 2015-01-21 DIAGNOSIS — Z961 Presence of intraocular lens: Secondary | ICD-10-CM | POA: Diagnosis not present

## 2015-01-21 DIAGNOSIS — H35052 Retinal neovascularization, unspecified, left eye: Secondary | ICD-10-CM | POA: Diagnosis not present

## 2015-01-21 DIAGNOSIS — H3570 Unspecified separation of retinal layers: Secondary | ICD-10-CM | POA: Diagnosis not present

## 2015-01-21 DIAGNOSIS — H3532 Exudative age-related macular degeneration: Secondary | ICD-10-CM | POA: Diagnosis not present

## 2015-02-26 DIAGNOSIS — Z125 Encounter for screening for malignant neoplasm of prostate: Secondary | ICD-10-CM | POA: Diagnosis not present

## 2015-02-26 DIAGNOSIS — I10 Essential (primary) hypertension: Secondary | ICD-10-CM | POA: Diagnosis not present

## 2015-02-26 DIAGNOSIS — R7301 Impaired fasting glucose: Secondary | ICD-10-CM | POA: Diagnosis not present

## 2015-03-04 DIAGNOSIS — Z0000 Encounter for general adult medical examination without abnormal findings: Secondary | ICD-10-CM | POA: Diagnosis not present

## 2015-03-04 DIAGNOSIS — R809 Proteinuria, unspecified: Secondary | ICD-10-CM | POA: Diagnosis not present

## 2015-03-04 DIAGNOSIS — J449 Chronic obstructive pulmonary disease, unspecified: Secondary | ICD-10-CM | POA: Diagnosis not present

## 2015-03-04 DIAGNOSIS — M179 Osteoarthritis of knee, unspecified: Secondary | ICD-10-CM | POA: Diagnosis not present

## 2015-03-04 DIAGNOSIS — M549 Dorsalgia, unspecified: Secondary | ICD-10-CM | POA: Diagnosis not present

## 2015-03-04 DIAGNOSIS — K219 Gastro-esophageal reflux disease without esophagitis: Secondary | ICD-10-CM | POA: Diagnosis not present

## 2015-03-04 DIAGNOSIS — R609 Edema, unspecified: Secondary | ICD-10-CM | POA: Diagnosis not present

## 2015-03-04 DIAGNOSIS — I440 Atrioventricular block, first degree: Secondary | ICD-10-CM | POA: Diagnosis not present

## 2015-03-04 DIAGNOSIS — Z1389 Encounter for screening for other disorder: Secondary | ICD-10-CM | POA: Diagnosis not present

## 2015-03-09 DIAGNOSIS — Z1212 Encounter for screening for malignant neoplasm of rectum: Secondary | ICD-10-CM | POA: Diagnosis not present

## 2015-03-25 DIAGNOSIS — Z961 Presence of intraocular lens: Secondary | ICD-10-CM | POA: Diagnosis not present

## 2015-03-25 DIAGNOSIS — H35052 Retinal neovascularization, unspecified, left eye: Secondary | ICD-10-CM | POA: Diagnosis not present

## 2015-03-25 DIAGNOSIS — H3532 Exudative age-related macular degeneration: Secondary | ICD-10-CM | POA: Diagnosis not present

## 2015-03-25 DIAGNOSIS — H3570 Unspecified separation of retinal layers: Secondary | ICD-10-CM | POA: Diagnosis not present

## 2015-07-10 DIAGNOSIS — Z23 Encounter for immunization: Secondary | ICD-10-CM | POA: Diagnosis not present

## 2015-07-29 DIAGNOSIS — H353223 Exudative age-related macular degeneration, left eye, with inactive scar: Secondary | ICD-10-CM | POA: Diagnosis not present

## 2015-07-29 DIAGNOSIS — H353211 Exudative age-related macular degeneration, right eye, with active choroidal neovascularization: Secondary | ICD-10-CM | POA: Diagnosis not present

## 2015-07-29 DIAGNOSIS — Z961 Presence of intraocular lens: Secondary | ICD-10-CM | POA: Diagnosis not present

## 2015-07-29 DIAGNOSIS — H26491 Other secondary cataract, right eye: Secondary | ICD-10-CM | POA: Diagnosis not present

## 2015-07-29 DIAGNOSIS — H3322 Serous retinal detachment, left eye: Secondary | ICD-10-CM | POA: Diagnosis not present

## 2015-09-21 DIAGNOSIS — H3322 Serous retinal detachment, left eye: Secondary | ICD-10-CM | POA: Diagnosis not present

## 2015-09-21 DIAGNOSIS — Z961 Presence of intraocular lens: Secondary | ICD-10-CM | POA: Diagnosis not present

## 2015-09-21 DIAGNOSIS — H353124 Nonexudative age-related macular degeneration, left eye, advanced atrophic with subfoveal involvement: Secondary | ICD-10-CM | POA: Diagnosis not present

## 2015-09-21 DIAGNOSIS — H353211 Exudative age-related macular degeneration, right eye, with active choroidal neovascularization: Secondary | ICD-10-CM | POA: Diagnosis not present

## 2015-09-21 DIAGNOSIS — H26491 Other secondary cataract, right eye: Secondary | ICD-10-CM | POA: Diagnosis not present

## 2015-10-26 DIAGNOSIS — H353211 Exudative age-related macular degeneration, right eye, with active choroidal neovascularization: Secondary | ICD-10-CM | POA: Diagnosis not present

## 2015-11-05 DIAGNOSIS — H26491 Other secondary cataract, right eye: Secondary | ICD-10-CM | POA: Diagnosis not present

## 2015-12-07 DIAGNOSIS — H353211 Exudative age-related macular degeneration, right eye, with active choroidal neovascularization: Secondary | ICD-10-CM | POA: Diagnosis not present

## 2016-01-18 DIAGNOSIS — H353211 Exudative age-related macular degeneration, right eye, with active choroidal neovascularization: Secondary | ICD-10-CM | POA: Diagnosis not present

## 2016-02-15 DIAGNOSIS — H353211 Exudative age-related macular degeneration, right eye, with active choroidal neovascularization: Secondary | ICD-10-CM | POA: Diagnosis not present

## 2016-04-18 DIAGNOSIS — H353211 Exudative age-related macular degeneration, right eye, with active choroidal neovascularization: Secondary | ICD-10-CM | POA: Diagnosis not present

## 2016-04-20 DIAGNOSIS — M1711 Unilateral primary osteoarthritis, right knee: Secondary | ICD-10-CM | POA: Diagnosis not present

## 2016-05-02 DIAGNOSIS — Z125 Encounter for screening for malignant neoplasm of prostate: Secondary | ICD-10-CM | POA: Diagnosis not present

## 2016-05-02 DIAGNOSIS — I10 Essential (primary) hypertension: Secondary | ICD-10-CM | POA: Diagnosis not present

## 2016-05-02 DIAGNOSIS — R7301 Impaired fasting glucose: Secondary | ICD-10-CM | POA: Diagnosis not present

## 2016-05-09 DIAGNOSIS — M1712 Unilateral primary osteoarthritis, left knee: Secondary | ICD-10-CM | POA: Diagnosis not present

## 2016-05-09 DIAGNOSIS — Z1389 Encounter for screening for other disorder: Secondary | ICD-10-CM | POA: Diagnosis not present

## 2016-05-09 DIAGNOSIS — M5489 Other dorsalgia: Secondary | ICD-10-CM | POA: Diagnosis not present

## 2016-05-09 DIAGNOSIS — R600 Localized edema: Secondary | ICD-10-CM | POA: Diagnosis not present

## 2016-05-09 DIAGNOSIS — M1711 Unilateral primary osteoarthritis, right knee: Secondary | ICD-10-CM | POA: Diagnosis not present

## 2016-05-09 DIAGNOSIS — C4491 Basal cell carcinoma of skin, unspecified: Secondary | ICD-10-CM | POA: Diagnosis not present

## 2016-05-09 DIAGNOSIS — H3530 Unspecified macular degeneration: Secondary | ICD-10-CM | POA: Diagnosis not present

## 2016-05-09 DIAGNOSIS — I440 Atrioventricular block, first degree: Secondary | ICD-10-CM | POA: Diagnosis not present

## 2016-05-09 DIAGNOSIS — Z0000 Encounter for general adult medical examination without abnormal findings: Secondary | ICD-10-CM | POA: Diagnosis not present

## 2016-05-16 DIAGNOSIS — H353211 Exudative age-related macular degeneration, right eye, with active choroidal neovascularization: Secondary | ICD-10-CM | POA: Diagnosis not present

## 2016-05-19 DIAGNOSIS — M1711 Unilateral primary osteoarthritis, right knee: Secondary | ICD-10-CM | POA: Diagnosis not present

## 2016-06-13 DIAGNOSIS — H353211 Exudative age-related macular degeneration, right eye, with active choroidal neovascularization: Secondary | ICD-10-CM | POA: Diagnosis not present

## 2016-06-29 DIAGNOSIS — M1711 Unilateral primary osteoarthritis, right knee: Secondary | ICD-10-CM | POA: Diagnosis not present

## 2016-07-06 DIAGNOSIS — M1711 Unilateral primary osteoarthritis, right knee: Secondary | ICD-10-CM | POA: Diagnosis not present

## 2016-07-08 DIAGNOSIS — Z23 Encounter for immunization: Secondary | ICD-10-CM | POA: Diagnosis not present

## 2016-07-13 DIAGNOSIS — M1711 Unilateral primary osteoarthritis, right knee: Secondary | ICD-10-CM | POA: Diagnosis not present

## 2016-07-20 DIAGNOSIS — M1711 Unilateral primary osteoarthritis, right knee: Secondary | ICD-10-CM | POA: Diagnosis not present

## 2016-07-25 DIAGNOSIS — H353211 Exudative age-related macular degeneration, right eye, with active choroidal neovascularization: Secondary | ICD-10-CM | POA: Diagnosis not present

## 2016-07-27 DIAGNOSIS — M1711 Unilateral primary osteoarthritis, right knee: Secondary | ICD-10-CM | POA: Diagnosis not present

## 2016-08-22 DIAGNOSIS — M1711 Unilateral primary osteoarthritis, right knee: Secondary | ICD-10-CM | POA: Diagnosis not present

## 2016-09-12 DIAGNOSIS — H353211 Exudative age-related macular degeneration, right eye, with active choroidal neovascularization: Secondary | ICD-10-CM | POA: Diagnosis not present

## 2016-09-13 DIAGNOSIS — K5900 Constipation, unspecified: Secondary | ICD-10-CM | POA: Diagnosis not present

## 2016-09-13 DIAGNOSIS — M179 Osteoarthritis of knee, unspecified: Secondary | ICD-10-CM | POA: Diagnosis not present

## 2016-09-13 DIAGNOSIS — I10 Essential (primary) hypertension: Secondary | ICD-10-CM | POA: Diagnosis not present

## 2016-09-13 DIAGNOSIS — Z6829 Body mass index (BMI) 29.0-29.9, adult: Secondary | ICD-10-CM | POA: Diagnosis not present

## 2016-09-27 ENCOUNTER — Encounter: Payer: Self-pay | Admitting: Physician Assistant

## 2016-09-27 ENCOUNTER — Other Ambulatory Visit (INDEPENDENT_AMBULATORY_CARE_PROVIDER_SITE_OTHER): Payer: Medicare Other

## 2016-09-27 ENCOUNTER — Ambulatory Visit (INDEPENDENT_AMBULATORY_CARE_PROVIDER_SITE_OTHER): Payer: Medicare Other | Admitting: Physician Assistant

## 2016-09-27 VITALS — BP 124/60 | HR 80 | Ht 70.0 in | Wt 205.0 lb

## 2016-09-27 DIAGNOSIS — R194 Change in bowel habit: Secondary | ICD-10-CM | POA: Diagnosis not present

## 2016-09-27 DIAGNOSIS — R140 Abdominal distension (gaseous): Secondary | ICD-10-CM

## 2016-09-27 DIAGNOSIS — K5900 Constipation, unspecified: Secondary | ICD-10-CM

## 2016-09-27 DIAGNOSIS — R609 Edema, unspecified: Secondary | ICD-10-CM

## 2016-09-27 DIAGNOSIS — R109 Unspecified abdominal pain: Secondary | ICD-10-CM

## 2016-09-27 LAB — CBC WITH DIFFERENTIAL/PLATELET
BASOS ABS: 0 10*3/uL (ref 0.0–0.1)
Basophils Relative: 0.4 % (ref 0.0–3.0)
EOS ABS: 0.2 10*3/uL (ref 0.0–0.7)
Eosinophils Relative: 1.6 % (ref 0.0–5.0)
HEMATOCRIT: 37.8 % — AB (ref 39.0–52.0)
Hemoglobin: 12.6 g/dL — ABNORMAL LOW (ref 13.0–17.0)
LYMPHS PCT: 13.9 % (ref 12.0–46.0)
Lymphs Abs: 1.6 10*3/uL (ref 0.7–4.0)
MCHC: 33.4 g/dL (ref 30.0–36.0)
MCV: 85.7 fl (ref 78.0–100.0)
Monocytes Absolute: 1 10*3/uL (ref 0.1–1.0)
Monocytes Relative: 9.2 % (ref 3.0–12.0)
NEUTROS ABS: 8.4 10*3/uL — AB (ref 1.4–7.7)
Neutrophils Relative %: 74.9 % (ref 43.0–77.0)
PLATELETS: 392 10*3/uL (ref 150.0–400.0)
RBC: 4.42 Mil/uL (ref 4.22–5.81)
RDW: 12.2 % (ref 11.5–15.5)
WBC: 11.1 10*3/uL — AB (ref 4.0–10.5)

## 2016-09-27 LAB — COMPREHENSIVE METABOLIC PANEL
ALT: 17 U/L (ref 0–53)
AST: 21 U/L (ref 0–37)
Albumin: 4 g/dL (ref 3.5–5.2)
Alkaline Phosphatase: 90 U/L (ref 39–117)
BILIRUBIN TOTAL: 0.5 mg/dL (ref 0.2–1.2)
BUN: 12 mg/dL (ref 6–23)
CALCIUM: 10.4 mg/dL (ref 8.4–10.5)
CO2: 35 meq/L — AB (ref 19–32)
CREATININE: 1.34 mg/dL (ref 0.40–1.50)
Chloride: 97 mEq/L (ref 96–112)
GFR: 53.18 mL/min — ABNORMAL LOW (ref >60.00)
GLUCOSE: 128 mg/dL — AB (ref 70–99)
Potassium: 4 mEq/L (ref 3.5–5.1)
Sodium: 140 mEq/L (ref 135–145)
Total Protein: 7.2 g/dL (ref 6.0–8.3)

## 2016-09-27 LAB — TSH: TSH: 2.03 u[IU]/mL (ref 0.35–4.50)

## 2016-09-27 MED ORDER — PREDNISONE 50 MG PO TABS: ORAL_TABLET | ORAL | 0 refills | Status: DC

## 2016-09-27 NOTE — Patient Instructions (Addendum)
Your physician has requested that you go to the basement for the following lab work before leaving today: CBC, CMET, TSH  We have given you a high fiber diet handout. Please strive to have 25-30 mg of fiber daily.   You can Miralax up to four times a day.   Stop Docusate.   Your provider suggest that you drink more water. Try to have at least 6-8 8 oz glasses of water daily.    Elevate legs.   You have been scheduled for a CT scan of the abdomen and pelvis at [facility] ([address]---this is in the same building as [facility]).   You are scheduled on 09/28/16 at 10:30 am. You should arrive 15 minutes prior to your appointment time for registration. Please follow the written instructions below on the day of your exam:  WARNING: IF YOU ARE ALLERGIC TO IODINE/X-RAY DYE, PLEASE NOTIFY RADIOLOGY IMMEDIATELY AT [phone]! YOU WILL BE GIVEN A 13 HOUR PREMEDICATION PREP.  1) Do not eat or drink anything after 6:30 am (4 hours prior to your test) 2) You have been given 2 bottles of oral contrast to drink. The solution may taste better if refrigerated, but do NOT add ice or any other liquid to this solution. Shake well before drinking.    Drink 1 bottle of contrast @ 8:30 am (2 hours prior to your exam)  Drink 1 bottle of contrast @ 9:30 am (1 hour prior to your exam)  You may take any medications as prescribed with a small amount of water except for the following: Metformin, Glucophage, Glucovance, Avandamet, Riomet, Fortamet, Actoplus Met, Janumet, Glumetza or Metaglip. The above medications must be held the day of the exam AND 48 hours after the exam.  The purpose of you drinking the oral contrast is to aid in the visualization of your intestinal tract. The contrast solution may cause some diarrhea. Before your exam is started, you will be given a small amount of fluid to drink. Depending on your individual set of symptoms, you may also receive an intravenous  injection of x-ray contrast/dye. Plan on being at [facility] for 30 minutes or longer, depending on the type of exam you are having performed.  This test typically takes 30-45 minutes to complete.  If you have any questions regarding your exam or if you need to reschedule, you may call the CT department at [phone] between the hours of 8:00 am and 5:00 pm, Monday-Friday.  ________________________________________________________________________  Because you have had a reaction in the past to contrast dye we will premedicate you. Prednisone 50 mg 13 hours before, 7 hours before, and 1 hour before. Then Benadryl 1 hour before the study.

## 2016-09-27 NOTE — Progress Notes (Signed)
Chief Complaint: Constipation, Abdominal Bloating  HPI:    Nicholas Diaz is a 82 year old Caucasian male who was referred to me by Crist Infante, MD for a complaint of constipation and abdominal bloating. Patient has seen Dr. Henrene Pastor in the past for a screening colonoscopy in 2007. This showed polyps, 3 tubular adenomas and 1 hyperplastic.   Per review of chart patient has been following with his primary care provider regarding these symptoms over the past month, they have tried a mixture of laxatives including docusate, MiraLAX, prunes and Linzess 145 mcg. None of these have been able to help the patient long-term.   Today, the patient presents to clinic accompanied by his son and tells me that over the past month he has had an abrupt change in bowel habits. He describes that before this time he would have typically 2 bowel movements a day and they were "like clockwork". Then a month ago due to nothing that he can remember he had a change to constipation telling me that he would not have a bowel movement without the help of a laxative and when he did it would give him diarrhea and the very next day he would be back to bloating and even some nausea with dry heaves and vomiting on 3 separate occasions over the past month. Patient initially was tried on MiraLAX which he took once a day when necessary on days of severe constipation, but this did not seem to help. He then started taking docusate tablets one in the morning and 2 at night, he does not feel like these really moved him either. He was then placed on Linzess 145 mcg on 09/21/2016 and tells me he took this for 4 days over the weekend and had at least 3-4 episodes of urgent watery diarrhea all days that he took the medicine, so he stopped this. He does tell me that he feels like he emptied out over the weekend but has not really had a good bowel movement since then. He tells me has sat on the toilet the past 2-3 days and strained and strained to have a very  small hard bowel movement. Associated symptoms include severe bloating and abdominal tension, this is typically relieved when he does have a bowel movement. Patient denies having taken a laxative on a daily basis other than the Linzess over the weekend which given severe diarrhea. Associated generalized abdominal discomfort.   Along with constipation above, patient tells me that he has had an increase in peripheral edema, noting swelling in both of his legs over this time as well. He tells me this has occurred in the past, but not to this extent. He does note that due to his constipation and then diarrhea when he takes a laxative he has not been moving around as much as before. His son tells me that prior to all this he was still golfing with him 2 days a week.   Patient describes that he does not drink enough water and in fact drinks a lot of decaffeinated beverages. He also denies a high-fiber diet.   Patient denies fever, chills, blood in his stool, melena, weight loss, fatigue and anorexia, heartburn, reflux or symptoms that awaken him at night.  Past Medical History: Hypertension  Current Outpatient Prescriptions  Medication Sig Dispense Refill  . ezetimibe-simvastatin (VYTORIN) 10-10 MG per tablet Take 1 tablet by mouth at bedtime.    . predniSONE (DELTASONE) 50 MG tablet Take as directed. One pill 13 hours before procedure, one  pill 7 hours before procedure, and 1 pill 1 hour before. 3 tablet 0   No current facility-administered medications for this visit.     Allergies as of 09/27/2016 - Review Complete 09/27/2016  Allergen Reaction Noted  . Ivp dye [iodinated diagnostic agents]  06/15/2012    Family History: Colon cancer in his paternal grandmother in her late 49s  Social History   Social History  . Marital status: Widowed    Spouse name: N/A  . Number of children: N/A  . Years of education: N/A   Occupational History  . Not on file.   Social History Main Topics  .  Smoking status: Never Smoker  . Smokeless tobacco: Not on file  . Alcohol use No  . Drug use: No  . Sexual activity: Not on file   Other Topics Concern  . Not on file   Social History Narrative  . No narrative on file    Review of Systems:    Constitutional: No weight loss, fever, chills, weakness or fatigue Skin: No rash  Cardiovascular: No chest pain Respiratory: No SOB Gastrointestinal: See HPI and otherwise negative Genitourinary: No dysuria or change in urinary frequency Neurological: No headache, dizziness or syncope Musculoskeletal: POsitive for b/l pitting edema No new muscle or joint pain Hematologic: No bleeding  Psychiatric: No history of depression or anxiety   Physical Exam:  Vital signs: BP 124/60   Pulse 80   Ht 5\' 10"  (1.778 m)   Wt 205 lb (93 kg)   BMI 29.41 kg/m   Constitutional:   Pleasant Caucasian male appears to be in NAD, Well developed, Well nourished, alert and cooperative Head:  Normocephalic and atraumatic. Eyes:   PEERL, EOMI. No icterus. Conjunctiva pink. Ears:  Normal auditory acuity. Neck:  Supple Throat: Oral cavity and pharynx without inflammation, swelling or lesion.  Respiratory: Respirations even and unlabored. Lungs clear to auscultation bilaterally.   No wheezes, crackles, or rhonchi.  Cardiovascular: Normal S1, S2. No MRG. Regular rate and rhythm. No peripheral edema, cyanosis or pallor.  Gastrointestinal:  Soft, Mod distension, nontender. No rebound or guarding. Decreased bowel sounds b/l lower quadrants. No appreciable masses or hepatomegaly. Rectal:  Not performed.  Msk: Symmetrical without gross deformities. Without edema, no deformity or joint abnormality.  Neurologic: Alert and oriented x4;  grossly normal neurologically.  Skin: Dry and intact without significant lesions or rashes. Psychiatric: Demonstrates good judgement and reason without abnormal affect or behaviors.  RELEVANT LABS AND IMAGING: CBC    Component Value  Date/Time   WBC 11.1 (H) 09/27/2016 1151   RBC 4.42 09/27/2016 1151   HGB 12.6 (L) 09/27/2016 1151   HCT 37.8 (L) 09/27/2016 1151   PLT 392.0 09/27/2016 1151   MCV 85.7 09/27/2016 1151   MCHC 33.4 09/27/2016 1151   RDW 12.2 09/27/2016 1151   LYMPHSABS 1.6 09/27/2016 1151   MONOABS 1.0 09/27/2016 1151   EOSABS 0.2 09/27/2016 1151   BASOSABS 0.0 09/27/2016 1151    CMP     Component Value Date/Time   NA 140 09/27/2016 1151   K 4.0 09/27/2016 1151   CL 97 09/27/2016 1151   CO2 35 (H) 09/27/2016 1151   GLUCOSE 128 (H) 09/27/2016 1151   BUN 12 09/27/2016 1151   CREATININE 1.34 09/27/2016 1151   CALCIUM 10.4 09/27/2016 1151   PROT 7.2 09/27/2016 1151   ALBUMIN 4.0 09/27/2016 1151   AST 21 09/27/2016 1151   ALT 17 09/27/2016 1151   ALKPHOS 90 09/27/2016 1151  BILITOT 0.5 09/27/2016 1151    Assessment: 1. Change in bowel habits:Abruptly a month ago towards constipation, now patient is having to use laxatives, has not gotten on a great regimen yet, no help for MiraLAX prn or docusate sodium tabs daily and too much help from Manuel Garcia 174mcg daily; due to abrupt change must consider possible obstruction due to mass versus other, last colonoscopy was 10 years ago and showed 3 tubular adenomas and 1 hyperplastic polyp 2. Constipation: As above 3. Bloating: With above 4. B/l Peripheral edema: Uncertain etiology, seems to be dependent in nature, recommend he follows PCP regarding this in the future 5. Abdominal Discomfort: likely with Constipation as above  Plan: 1. Ordered Ct Abdomen and pelvis due to patient's history of colon polyps and abrupt change in bowel habits. 2. Ordered labs to include CBC, CMP and TSH, see above, these were essentially normal other than a slight elevation in white count 3. Recommend patient increase fiber in his diet to at least 25-35 g daily with use of fiber supplement. Provided him with a high-fiber handout 4. Recommend patient increase his water intake to  at least 6-8 8 ounce glasses of water per day  5. Recommend the patient exercise on a daily basis 6. Recommend the patient use MiraLAX 1-4 times daily titrated to a soft formed bowel movement. He should use this on a daily basis. We discussed titration in detail. 7. Patient to return to clinic with Dr. Henrene Pastor in 1-2 months or sooner if necessary.  Ellouise Newer, PA-C [facility] 09/27/2016, 3:20 PM  Cc: Crist Infante, MD

## 2016-09-28 ENCOUNTER — Ambulatory Visit (INDEPENDENT_AMBULATORY_CARE_PROVIDER_SITE_OTHER)
Admission: RE | Admit: 2016-09-28 | Discharge: 2016-09-28 | Disposition: A | Discharge status: Home / Self Care | Payer: Medicare Other | Source: Ambulatory Visit | Attending: Physician Assistant | Admitting: Physician Assistant

## 2016-09-28 ENCOUNTER — Telehealth: Payer: Self-pay

## 2016-09-28 ENCOUNTER — Other Ambulatory Visit: Payer: Self-pay

## 2016-09-28 DIAGNOSIS — R109 Unspecified abdominal pain: Secondary | ICD-10-CM | POA: Diagnosis not present

## 2016-09-28 DIAGNOSIS — R194 Change in bowel habit: Secondary | ICD-10-CM

## 2016-09-28 DIAGNOSIS — R140 Abdominal distension (gaseous): Secondary | ICD-10-CM

## 2016-09-28 DIAGNOSIS — K5900 Constipation, unspecified: Secondary | ICD-10-CM | POA: Diagnosis not present

## 2016-09-28 MED ORDER — IOPAMIDOL (ISOVUE-300) INJECTION 61%
80.0000 mL | Freq: Once | INTRAVENOUS | Status: AC | PRN
  Administered 2016-09-28: 80 mL via INTRAVENOUS

## 2016-09-28 MED ORDER — PANTOPRAZOLE SODIUM 40 MG PO TBEC: 40.0000 mg | DELAYED_RELEASE_TABLET | Freq: Two times a day (BID) | ORAL | 3 refills | Status: DC

## 2016-09-28 NOTE — Progress Notes (Signed)
Agree with initial assessment and plans 

## 2016-09-28 NOTE — Telephone Encounter (Signed)
Pt aware and referral has been made, records and referral form faxed to [facility]

## 2016-09-28 NOTE — Telephone Encounter (Signed)
-----   Message from Levin Erp, [place] sent at 09/28/2016  1:21 PM EST ----- Regarding: CT Pt had CT today with signs of hydronephrosis, per radiologist, recommended referral to urology regarding this. Please call pt and let him know and arrange referral. Thanks-waiting on Dr. Henrene Pastor to review rest of CT for further recs concerning Gi systems/constipation. No sign of colon mass though.   Thanks-JLL

## 2016-10-10 ENCOUNTER — Encounter (HOSPITAL_COMMUNITY): Payer: Self-pay | Admitting: Emergency Medicine

## 2016-10-10 ENCOUNTER — Inpatient Hospital Stay (HOSPITAL_COMMUNITY)
Admission: EM | Admit: 2016-10-10 | Discharge: 2016-10-14 | DRG: 384 | Disposition: A | Discharge status: Home / Self Care | Payer: Medicare Other | Attending: Internal Medicine | Admitting: Internal Medicine

## 2016-10-10 DIAGNOSIS — R1084 Generalized abdominal pain: Secondary | ICD-10-CM | POA: Diagnosis not present

## 2016-10-10 DIAGNOSIS — Z91041 Radiographic dye allergy status: Secondary | ICD-10-CM

## 2016-10-10 DIAGNOSIS — Z800 Family history of malignant neoplasm of digestive organs: Secondary | ICD-10-CM

## 2016-10-10 DIAGNOSIS — N1330 Unspecified hydronephrosis: Secondary | ICD-10-CM | POA: Diagnosis not present

## 2016-10-10 DIAGNOSIS — K311 Adult hypertrophic pyloric stenosis: Secondary | ICD-10-CM | POA: Diagnosis present

## 2016-10-10 DIAGNOSIS — Z87891 Personal history of nicotine dependence: Secondary | ICD-10-CM

## 2016-10-10 DIAGNOSIS — N183 Chronic kidney disease, stage 3 unspecified: Secondary | ICD-10-CM | POA: Diagnosis present

## 2016-10-10 DIAGNOSIS — I712 Thoracic aortic aneurysm, without rupture: Secondary | ICD-10-CM | POA: Diagnosis present

## 2016-10-10 DIAGNOSIS — Z7982 Long term (current) use of aspirin: Secondary | ICD-10-CM

## 2016-10-10 DIAGNOSIS — Z79899 Other long term (current) drug therapy: Secondary | ICD-10-CM

## 2016-10-10 DIAGNOSIS — K253 Acute gastric ulcer without hemorrhage or perforation: Secondary | ICD-10-CM | POA: Diagnosis present

## 2016-10-10 DIAGNOSIS — M7989 Other specified soft tissue disorders: Secondary | ICD-10-CM | POA: Diagnosis present

## 2016-10-10 DIAGNOSIS — K259 Gastric ulcer, unspecified as acute or chronic, without hemorrhage or perforation: Secondary | ICD-10-CM | POA: Diagnosis not present

## 2016-10-10 DIAGNOSIS — Z9889 Other specified postprocedural states: Secondary | ICD-10-CM

## 2016-10-10 DIAGNOSIS — Z6829 Body mass index (BMI) 29.0-29.9, adult: Secondary | ICD-10-CM | POA: Diagnosis not present

## 2016-10-10 DIAGNOSIS — N132 Hydronephrosis with renal and ureteral calculous obstruction: Secondary | ICD-10-CM | POA: Diagnosis not present

## 2016-10-10 DIAGNOSIS — Z1389 Encounter for screening for other disorder: Secondary | ICD-10-CM | POA: Diagnosis not present

## 2016-10-10 DIAGNOSIS — R1115 Cyclical vomiting syndrome unrelated to migraine: Secondary | ICD-10-CM

## 2016-10-10 DIAGNOSIS — R1110 Vomiting, unspecified: Secondary | ICD-10-CM | POA: Diagnosis not present

## 2016-10-10 DIAGNOSIS — D72829 Elevated white blood cell count, unspecified: Secondary | ICD-10-CM | POA: Diagnosis present

## 2016-10-10 DIAGNOSIS — K209 Esophagitis, unspecified: Secondary | ICD-10-CM | POA: Diagnosis present

## 2016-10-10 DIAGNOSIS — R918 Other nonspecific abnormal finding of lung field: Secondary | ICD-10-CM | POA: Diagnosis not present

## 2016-10-10 DIAGNOSIS — E785 Hyperlipidemia, unspecified: Secondary | ICD-10-CM | POA: Diagnosis not present

## 2016-10-10 DIAGNOSIS — N179 Acute kidney failure, unspecified: Secondary | ICD-10-CM | POA: Diagnosis present

## 2016-10-10 HISTORY — DX: Hyperlipidemia, unspecified: E78.5

## 2016-10-10 LAB — COMPREHENSIVE METABOLIC PANEL
ALBUMIN: 3.9 g/dL (ref 3.5–5.0)
ALK PHOS: 119 U/L (ref 38–126)
ALT: 50 U/L (ref 17–63)
AST: 32 U/L (ref 15–41)
Anion gap: 13 (ref 5–15)
BILIRUBIN TOTAL: 0.4 mg/dL (ref 0.3–1.2)
BUN: 19 mg/dL (ref 6–20)
CALCIUM: 10.3 mg/dL (ref 8.9–10.3)
CO2: 27 mmol/L (ref 22–32)
Chloride: 95 mmol/L — ABNORMAL LOW (ref 101–111)
Creatinine, Ser: 1.27 mg/dL — ABNORMAL HIGH (ref 0.61–1.24)
GFR calc Af Amer: 56 mL/min — ABNORMAL LOW (ref >60)
GFR, EST NON AFRICAN AMERICAN: 48 mL/min — AB (ref >60)
GLUCOSE: 128 mg/dL — AB (ref 65–99)
Potassium: 3.7 mmol/L (ref 3.5–5.1)
Sodium: 135 mmol/L (ref 135–145)
TOTAL PROTEIN: 7.1 g/dL (ref 6.5–8.1)

## 2016-10-10 LAB — LIPASE, BLOOD: Lipase: 43 U/L (ref 11–51)

## 2016-10-10 LAB — I-STAT CG4 LACTIC ACID, ED: Lactic Acid, Venous: 1.24 mmol/L (ref 0.5–1.9)

## 2016-10-10 LAB — CBC
HCT: 39.4 % (ref 39.0–52.0)
Hemoglobin: 12.9 g/dL — ABNORMAL LOW (ref 13.0–17.0)
MCH: 28.1 pg (ref 26.0–34.0)
MCHC: 32.7 g/dL (ref 30.0–36.0)
MCV: 85.8 fL (ref 78.0–100.0)
PLATELETS: 416 10*3/uL — AB (ref 150–400)
RBC: 4.59 MIL/uL (ref 4.22–5.81)
RDW: 12.4 % (ref 11.5–15.5)
WBC: 16.6 10*3/uL — AB (ref 4.0–10.5)

## 2016-10-10 LAB — PROTIME-INR
INR: 1.2
PROTHROMBIN TIME: 15.3 s — AB (ref 11.4–15.2)

## 2016-10-10 MED ORDER — SODIUM CHLORIDE 0.9 % IV SOLN
1000.0000 mL | Freq: Once | INTRAVENOUS | Status: AC
  Administered 2016-10-10: 1000 mL via INTRAVENOUS

## 2016-10-10 MED ORDER — IOPAMIDOL (ISOVUE-300) INJECTION 61%
INTRAVENOUS | Status: AC
  Administered 2016-10-11: 100 mL
  Filled 2016-10-10: qty 100

## 2016-10-10 MED ORDER — DIPHENHYDRAMINE HCL 50 MG/ML IJ SOLN
25.0000 mg | Freq: Once | INTRAMUSCULAR | Status: AC
  Administered 2016-10-10: 25 mg via INTRAVENOUS
  Filled 2016-10-10: qty 1

## 2016-10-10 MED ORDER — METHYLPREDNISOLONE SODIUM SUCC 125 MG IJ SOLR
125.0000 mg | Freq: Once | INTRAMUSCULAR | Status: AC
  Administered 2016-10-10: 125 mg via INTRAVENOUS
  Filled 2016-10-10: qty 2

## 2016-10-10 MED ORDER — ONDANSETRON HCL 4 MG/2ML IJ SOLN
4.0000 mg | Freq: Once | INTRAMUSCULAR | Status: AC
  Administered 2016-10-10: 4 mg via INTRAVENOUS
  Filled 2016-10-10: qty 2

## 2016-10-10 MED ORDER — MORPHINE SULFATE (PF) 4 MG/ML IV SOLN: 8.0000 mg | Freq: Once | INTRAVENOUS | Status: DC

## 2016-10-10 MED ORDER — SODIUM CHLORIDE 0.9 % IV SOLN
1000.0000 mL | INTRAVENOUS | Status: DC
  Administered 2016-10-10: 1000 mL via INTRAVENOUS

## 2016-10-10 MED ORDER — IOPAMIDOL (ISOVUE-300) INJECTION 61%
INTRAVENOUS | Status: AC
  Filled 2016-10-10: qty 30

## 2016-10-10 NOTE — ED Notes (Signed)
Pt placed on monitor.  

## 2016-10-10 NOTE — ED Triage Notes (Signed)
Per pt, states he was at PCP for abdominal pain, distention and vomiting-states he has been worked up with CT scan and labs-PCP sent him here for elevated WBC and liver enzymes

## 2016-10-10 NOTE — ED Notes (Signed)
Pt's daughter states "went to Dr. Dutch Gray office today and he wanted him to come him for a GI work up.  Dad's been vomiting since Christmas Day."

## 2016-10-11 ENCOUNTER — Observation Stay (HOSPITAL_COMMUNITY): Payer: Medicare Other

## 2016-10-11 ENCOUNTER — Encounter (HOSPITAL_COMMUNITY): Payer: Self-pay

## 2016-10-11 ENCOUNTER — Encounter (HOSPITAL_COMMUNITY)
Admission: EM | Disposition: A | Discharge status: Home / Self Care | Payer: Self-pay | Source: Home / Self Care | Attending: Internal Medicine

## 2016-10-11 ENCOUNTER — Ambulatory Visit: Payer: Medicare Other | Admitting: Gastroenterology

## 2016-10-11 ENCOUNTER — Emergency Department (HOSPITAL_COMMUNITY): Payer: Medicare Other

## 2016-10-11 DIAGNOSIS — M7989 Other specified soft tissue disorders: Secondary | ICD-10-CM

## 2016-10-11 DIAGNOSIS — Z800 Family history of malignant neoplasm of digestive organs: Secondary | ICD-10-CM | POA: Diagnosis not present

## 2016-10-11 DIAGNOSIS — K311 Adult hypertrophic pyloric stenosis: Secondary | ICD-10-CM | POA: Diagnosis not present

## 2016-10-11 DIAGNOSIS — M79609 Pain in unspecified limb: Secondary | ICD-10-CM | POA: Diagnosis not present

## 2016-10-11 DIAGNOSIS — D72829 Elevated white blood cell count, unspecified: Secondary | ICD-10-CM | POA: Diagnosis not present

## 2016-10-11 DIAGNOSIS — K253 Acute gastric ulcer without hemorrhage or perforation: Secondary | ICD-10-CM | POA: Diagnosis not present

## 2016-10-11 DIAGNOSIS — R918 Other nonspecific abnormal finding of lung field: Secondary | ICD-10-CM | POA: Diagnosis not present

## 2016-10-11 DIAGNOSIS — N183 Chronic kidney disease, stage 3 unspecified: Secondary | ICD-10-CM | POA: Diagnosis present

## 2016-10-11 DIAGNOSIS — Z91041 Radiographic dye allergy status: Secondary | ICD-10-CM | POA: Diagnosis not present

## 2016-10-11 DIAGNOSIS — Z7982 Long term (current) use of aspirin: Secondary | ICD-10-CM | POA: Diagnosis not present

## 2016-10-11 DIAGNOSIS — N179 Acute kidney failure, unspecified: Secondary | ICD-10-CM | POA: Diagnosis present

## 2016-10-11 DIAGNOSIS — R634 Abnormal weight loss: Secondary | ICD-10-CM

## 2016-10-11 DIAGNOSIS — Z79899 Other long term (current) drug therapy: Secondary | ICD-10-CM | POA: Diagnosis not present

## 2016-10-11 DIAGNOSIS — R1110 Vomiting, unspecified: Secondary | ICD-10-CM | POA: Diagnosis not present

## 2016-10-11 DIAGNOSIS — N132 Hydronephrosis with renal and ureteral calculous obstruction: Secondary | ICD-10-CM | POA: Diagnosis not present

## 2016-10-11 DIAGNOSIS — I712 Thoracic aortic aneurysm, without rupture: Secondary | ICD-10-CM | POA: Diagnosis present

## 2016-10-11 DIAGNOSIS — Z87891 Personal history of nicotine dependence: Secondary | ICD-10-CM | POA: Diagnosis not present

## 2016-10-11 DIAGNOSIS — R1115 Cyclical vomiting syndrome unrelated to migraine: Secondary | ICD-10-CM

## 2016-10-11 DIAGNOSIS — K259 Gastric ulcer, unspecified as acute or chronic, without hemorrhage or perforation: Secondary | ICD-10-CM | POA: Diagnosis not present

## 2016-10-11 DIAGNOSIS — Z9889 Other specified postprocedural states: Secondary | ICD-10-CM | POA: Diagnosis not present

## 2016-10-11 DIAGNOSIS — K209 Esophagitis, unspecified: Secondary | ICD-10-CM | POA: Diagnosis present

## 2016-10-11 DIAGNOSIS — E785 Hyperlipidemia, unspecified: Secondary | ICD-10-CM | POA: Diagnosis not present

## 2016-10-11 HISTORY — PX: ESOPHAGOGASTRODUODENOSCOPY: SHX5428

## 2016-10-11 LAB — CBC
HCT: 33.4 % — ABNORMAL LOW (ref 39.0–52.0)
Hemoglobin: 10.9 g/dL — ABNORMAL LOW (ref 13.0–17.0)
MCH: 28.8 pg (ref 26.0–34.0)
MCHC: 32.6 g/dL (ref 30.0–36.0)
MCV: 88.1 fL (ref 78.0–100.0)
PLATELETS: 348 10*3/uL (ref 150–400)
RBC: 3.79 MIL/uL — ABNORMAL LOW (ref 4.22–5.81)
RDW: 12.7 % (ref 11.5–15.5)
WBC: 14.3 10*3/uL — AB (ref 4.0–10.5)

## 2016-10-11 LAB — URINALYSIS, ROUTINE W REFLEX MICROSCOPIC
BACTERIA UA: NONE SEEN
BILIRUBIN URINE: NEGATIVE
Glucose, UA: NEGATIVE mg/dL
Hgb urine dipstick: NEGATIVE
KETONES UR: 5 mg/dL — AB
Leukocytes, UA: NEGATIVE
Nitrite: NEGATIVE
PROTEIN: 30 mg/dL — AB
SQUAMOUS EPITHELIAL / LPF: NONE SEEN
Specific Gravity, Urine: 1.044 — ABNORMAL HIGH (ref 1.005–1.030)
pH: 5 (ref 5.0–8.0)

## 2016-10-11 SURGERY — EGD (ESOPHAGOGASTRODUODENOSCOPY)
Anesthesia: Moderate Sedation

## 2016-10-11 MED ORDER — DIPHENHYDRAMINE HCL 25 MG PO CAPS
25.0000 mg | ORAL_CAPSULE | Freq: Once | ORAL | Status: DC
  Filled 2016-10-11: qty 1

## 2016-10-11 MED ORDER — PANTOPRAZOLE SODIUM 40 MG IV SOLR
40.0000 mg | Freq: Two times a day (BID) | INTRAVENOUS | Status: DC
  Administered 2016-10-11 – 2016-10-13 (×5): 40 mg via INTRAVENOUS
  Filled 2016-10-11 (×8): qty 40

## 2016-10-11 MED ORDER — SODIUM CHLORIDE 0.9 % IV SOLN: INTRAVENOUS | Status: DC

## 2016-10-11 MED ORDER — HYDROMORPHONE HCL 1 MG/ML IJ SOLN: 0.5000 mg | INTRAMUSCULAR | Status: DC | PRN

## 2016-10-11 MED ORDER — SORBITOL 70 % SOLN
960.0000 mL | TOPICAL_OIL | Freq: Once | ORAL | Status: AC
  Administered 2016-10-11: 960 mL via RECTAL
  Filled 2016-10-11: qty 240

## 2016-10-11 MED ORDER — MIDAZOLAM HCL 5 MG/ML IJ SOLN
INTRAMUSCULAR | Status: AC
  Filled 2016-10-11: qty 2

## 2016-10-11 MED ORDER — SODIUM CHLORIDE 0.9 % IV SOLN
INTRAVENOUS | Status: DC
  Administered 2016-10-11 (×2): 1000 mL via INTRAVENOUS

## 2016-10-11 MED ORDER — DIPHENHYDRAMINE HCL 50 MG/ML IJ SOLN
25.0000 mg | Freq: Once | INTRAMUSCULAR | Status: AC
  Administered 2016-10-11: 25 mg via INTRAVENOUS
  Filled 2016-10-11: qty 1

## 2016-10-11 MED ORDER — BUTAMBEN-TETRACAINE-BENZOCAINE 2-2-14 % EX AERO
INHALATION_SPRAY | CUTANEOUS | Status: DC | PRN
  Administered 2016-10-11: 2 via TOPICAL

## 2016-10-11 MED ORDER — MIDAZOLAM HCL 10 MG/2ML IJ SOLN
INTRAMUSCULAR | Status: DC | PRN
Start: 2016-10-11 — End: 2016-10-11
  Administered 2016-10-11: 2 mg via INTRAVENOUS

## 2016-10-11 MED ORDER — FENTANYL CITRATE (PF) 100 MCG/2ML IJ SOLN
INTRAMUSCULAR | Status: AC
  Filled 2016-10-11: qty 2

## 2016-10-11 MED ORDER — ONDANSETRON HCL 4 MG/2ML IJ SOLN
4.0000 mg | Freq: Four times a day (QID) | INTRAMUSCULAR | Status: DC | PRN
  Administered 2016-10-11: 4 mg via INTRAVENOUS
  Filled 2016-10-11: qty 2

## 2016-10-11 MED ORDER — ORAL CARE MOUTH RINSE
15.0000 mL | Freq: Two times a day (BID) | OROMUCOSAL | Status: DC
  Administered 2016-10-11 – 2016-10-13 (×5): 15 mL via OROMUCOSAL

## 2016-10-11 MED ORDER — METOCLOPRAMIDE HCL 5 MG/ML IJ SOLN
10.0000 mg | Freq: Once | INTRAMUSCULAR | Status: AC
  Administered 2016-10-11: 10 mg via INTRAVENOUS
  Filled 2016-10-11: qty 2

## 2016-10-11 MED ORDER — FENTANYL CITRATE (PF) 100 MCG/2ML IJ SOLN
INTRAMUSCULAR | Status: DC | PRN
  Administered 2016-10-11: 25 ug via INTRAVENOUS

## 2016-10-11 MED ORDER — ONDANSETRON HCL 4 MG PO TABS: 4.0000 mg | ORAL_TABLET | Freq: Four times a day (QID) | ORAL | Status: DC | PRN

## 2016-10-11 MED ORDER — FAMOTIDINE IN NACL 20-0.9 MG/50ML-% IV SOLN
20.0000 mg | INTRAVENOUS | Status: DC
  Administered 2016-10-11 – 2016-10-13 (×3): 20 mg via INTRAVENOUS
  Filled 2016-10-11 (×4): qty 50

## 2016-10-11 NOTE — ED Provider Notes (Signed)
CT shows probable gastric outlet obstruction. Vomiting has currently stopped, given his vomiting and bloating I think he needs admission for GI consult and likely endoscopy. NPO  Dr Loleta Books to admit, medsurg obs   Sherwood Gambler, MD 10/11/16 [phone]

## 2016-10-11 NOTE — Progress Notes (Signed)
*  PRELIMINARY RESULTS* Vascular Ultrasound Left lower extremity venous duplex has been completed.  Preliminary findings: No evidence of deep vein thrombosis or baker's cysts in the left lower extremity.   Myrtie Cruise Jerilee Space 10/11/2016, 10:04 AM

## 2016-10-11 NOTE — ED Provider Notes (Signed)
[name] Note   CSN: [id number] Arrival date & time: 10/10/16  [id number]     History   Chief Complaint Chief Complaint  Patient presents with  . Abdominal Pain    HPI Nicholas Diaz is a 83 y.o. male.  HPI Patient presents to the emergency department with seven-day days of nausea and vomiting and decreased oral intake.  He reports 15 pound weight loss over the past several weeks.  Family reports increasing abdominal distention.  He did have a CT scan performed mid-December which had several nonspecific findings.  This originally began as some constipation he was told by his GI doctor to take MiraLAX.  Patient reports he is having some watery loose stools.  No gross blood.  Denies hematemesis.  He feels weak and tired.  Symptoms are moderate to severe in severity.  No other complaints.   Past Medical History:  Diagnosis Date  . Hyperlipidemia     Patient Active Problem List   Diagnosis Date Noted  . Difficulty in walking(719.7) 06/21/2012  . Stiffness of vertebral column 06/21/2012    No past surgical history on file.     Home Medications    Prior to Admission medications   Medication Sig Start Date End Date Taking? Authorizing Provider  aspirin EC 81 MG tablet Take 81 mg by mouth daily.    Yes Historical Provider, MD  Cholecalciferol (VITAMIN D-1000 MAX ST) 1000 units tablet Take 1,000 Units by mouth daily.    Yes Historical Provider, MD  ezetimibe-simvastatin (VYTORIN) 10-10 MG per tablet Take 1 tablet by mouth at bedtime.   Yes Historical Provider, MD  Multiple Vitamin (MULTI-VITAMINS) TABS Take 1 tablet by mouth daily.    Yes Historical Provider, MD  Omega-3 Fatty Acids (FISH OIL PO) Take 1 capsule by mouth daily.    Yes Historical Provider, MD  omeprazole (PRILOSEC) 40 MG capsule Take 40 mg by mouth daily.   Yes Historical Provider, MD  pantoprazole (PROTONIX) 40 MG tablet Take 1 tablet (40 mg total) by mouth 2 (two) times daily. Patient not taking:  Reported on 10/10/2016 09/28/16   Irene Shipper, MD  polyvinyl alcohol (LIQUIFILM TEARS) 1.4 % ophthalmic solution Place 1 drop into both eyes daily as needed for dry eyes    Historical Provider, MD  predniSONE (DELTASONE) 50 MG tablet Take as directed. One pill 13 hours before procedure, one pill 7 hours before procedure, and 1 pill 1 hour before. Patient not taking: Reported on 10/10/2016 09/27/16   Levin Erp, PA    Family History No family history on file.  Social History Social History  Substance Use Topics  . Smoking status: Never Smoker  . Smokeless tobacco: Not on file  . Alcohol use No     Allergies   Ivp dye [iodinated diagnostic agents]   Review of Systems Review of Systems  All other systems reviewed and are negative.    Physical Exam Updated Vital Signs BP 151/90   Pulse 96   Temp 98.7 F (37.1 C) (Oral)   Resp 14   SpO2 96%   Physical Exam  Constitutional: He is oriented to person, place, and time. He appears well-developed and well-nourished.  HENT:  Head: Normocephalic and atraumatic.  Eyes: EOM are normal.  Neck: Normal range of motion.  Cardiovascular: Regular rhythm, normal heart sounds and intact distal pulses.   Tachycardic  Pulmonary/Chest: Effort normal and breath sounds normal. No respiratory distress.  Abdominal: Soft. He exhibits distension.  Generalized  abdominal tenderness without guarding or rebound.  No peritoneal signs.  Musculoskeletal: Normal range of motion.  Neurological: He is alert and oriented to person, place, and time.  Skin: Skin is warm and dry.  Psychiatric: He has a normal mood and affect. Judgment normal.  Nursing note and vitals reviewed.    ED Treatments / Results  Labs (all labs ordered are listed, but only abnormal results are displayed) Labs Reviewed  COMPREHENSIVE METABOLIC PANEL - Abnormal; Notable for the following:       Result Value   Chloride 95 (*)    Glucose, Bld 128 (*)    Creatinine, Ser  1.27 (*)    GFR calc non Af Amer 48 (*)    GFR calc Af Amer 56 (*)    All other components within normal limits  CBC - Abnormal; Notable for the following:    WBC 16.6 (*)    Hemoglobin 12.9 (*)    Platelets 416 (*)    All other components within normal limits  PROTIME-INR - Abnormal; Notable for the following:    Prothrombin Time 15.3 (*)    All other components within normal limits  LIPASE, BLOOD  URINALYSIS, ROUTINE W REFLEX MICROSCOPIC  I-STAT CG4 LACTIC ACID, ED    EKG  EKG Interpretation None       Radiology No results found.  Procedures Procedures (including critical care time)  Medications Ordered in ED Medications  0.9 %  sodium chloride infusion (0 mLs Intravenous Stopped 10/10/16 2230)    Followed by  0.9 %  sodium chloride infusion (0 mLs Intravenous Stopped 10/10/16 2306)    Followed by  0.9 %  sodium chloride infusion (1,000 mLs Intravenous New Bag/Given 10/10/16 2307)  morphine 4 MG/ML injection 8 mg (not administered)  iopamidol (ISOVUE-300) 61 % injection (not administered)  iopamidol (ISOVUE-300) 61 % injection (not administered)  diphenhydrAMINE (BENADRYL) capsule 25 mg (not administered)  diphenhydrAMINE (BENADRYL) injection 25 mg (25 mg Intravenous Given 10/10/16 2148)  methylPREDNISolone sodium succinate (SOLU-MEDROL) 125 mg/2 mL injection 125 mg (125 mg Intravenous Given 10/10/16 2156)  ondansetron (ZOFRAN) injection 4 mg (4 mg Intravenous Given 10/10/16 2151)     Initial Impression / Assessment and Plan / ED Course  I have reviewed the triage vital signs and the nursing notes.  Pertinent labs & imaging results that were available during my care of the patient were reviewed by me and considered in my medical decision making (see chart for details).  Clinical Course     Patient will undergo CT imaging of his chest abdomen pelvis as this is concerning for possible developing intra-abdominal cancer.  Considerations would definitely  be peritoneal  carcinomatosis versus bowel obstruction versus intra-abdominal infection.  Patient has a IV dye allergy was tolerated IV contrast before with the 4 hour pretreatment prep.  The patient's complaints I feel as though this is appropriate for a correct diagnosis to be made.  IV fluids given.  Nausea and pain medications ordered.  Patient has had no vomiting in the ER.  Care to Dr Regenia Skeeter to followup on CT imaging and disposition.  Family updated.  Final Clinical Impressions(s) / ED Diagnoses   Final diagnoses:  None    New Prescriptions New Prescriptions   No medications on file     Jola Schmidt, MD 10/11/16 [phone]

## 2016-10-11 NOTE — ED Notes (Signed)
Pt vomiting, Dr. Venora Maples informed.

## 2016-10-11 NOTE — H&P (Signed)
History and Physical  Patient Name: Nicholas Diaz     [id number]    DOB: [birth year]    DOA: 10/10/2016 PCP: Jerlyn Ly, MD   Patient coming from: Home --> PCP's office  Chief Complaint: Vomiting and bloating  HPI: ENEDINO CORIO is a 83 y.o. male with a past medical history significant for hyperlipidemia who presents with 1 month bloating and abdominal pain.  The patient was in his usual state of health until about one month ago when he had sudden onset of bloating and constipation. He saw his PCP, who referred him to GI, who he saw on 12/20.  At that time, his lab work was unremarkable, and a CT of the abdomen and pelvis showed nonspecific pancreatic stranding and hydronephrosis and so he was referred to Urology in February and told to take MiraLAX and follow up with PCP.  Now in the last week, he has had worsening of his bloating, vomiting, and abdominal distension.  He has minimal pain, but notes a 15 lbs weight loss.  No hematemesis, dyspepsia, melena.  No previous pancreatitis, ulcers.  Had colonoscopy once 10 years ago, saw some polyps, no follow up since.  Does not use NSAIDs or blood thinners. Smoked 60 years, quit in the 71s.  No alcohol history.  Today because his vomiting and bloating were getting worse and went to see his PCP, who drew labs and called him later to say that he had a leukocytosis and elevated LFTs and recommended he come to the hospital for antibiotics.  ED course: -afebrile, heart rate 105, respirations and pulse ox normal, blood pressure 135/89 -Na 135, K 3.7, Cr 1.27 (baseline 1.3), WBC 16.6K, Hgb 12.9, LFTs normal -Lipase normal -INR normal -Lactic acid 1.2 -CT of the abdomen and pelvis with contrast showed gastric outlet obstruction, no bladder disease, no clear pancreatitis; they're also incidental findings of thoracic artery aneurysm, and a 9 mm spiculated lung mass.     ROS: Review of Systems  Constitutional: Positive for weight loss. Negative for  chills and fever.  Gastrointestinal: Positive for constipation, nausea and vomiting. Negative for abdominal pain, blood in stool, heartburn and melena.          Past Medical History:  Diagnosis Date  . Hyperlipidemia     Past Surgical History:  Procedure Laterality Date  . HERNIA REPAIR Right     Social History: Patient lives alone.  The patient walks unassisted.  Still drives some.  Worked for [employer].  From [place] originally.  Smoked for 60 years, quit in the 80s, smoked a pipe a little while longer.  No alcohol.    Allergies  Allergen Reactions  . Ivp Dye [Iodinated Diagnostic Agents]     Dye that was used for macular degeneration     Family history: family history includes Colon cancer in his paternal grandmother.  Prior to Admission medications   Medication Sig Start Date End Date Taking? Authorizing Provider  aspirin EC 81 MG tablet Take 81 mg by mouth daily.    Yes Historical Provider, MD  Cholecalciferol (VITAMIN D-1000 MAX ST) 1000 units tablet Take 1,000 Units by mouth daily.    Yes Historical Provider, MD  ezetimibe-simvastatin (VYTORIN) 10-10 MG per tablet Take 1 tablet by mouth at bedtime.   Yes Historical Provider, MD  Multiple Vitamin (MULTI-VITAMINS) TABS Take 1 tablet by mouth daily.    Yes Historical Provider, MD  Omega-3 Fatty Acids (FISH OIL PO) Take 1 capsule by mouth daily.  Yes Historical Provider, MD  omeprazole (PRILOSEC) 40 MG capsule Take 40 mg by mouth daily.   Yes Historical Provider, MD  polyvinyl alcohol (LIQUIFILM TEARS) 1.4 % ophthalmic solution Place 1 drop into both eyes daily as needed for dry eyes    Historical Provider, MD       Physical Exam: BP 136/59   Pulse 106   Temp 98.7 F (37.1 C) (Oral)   Resp 25   SpO2 95%  General appearance: Well-developed, elderly adult male, alert and in no acute distress.   Eyes: Anicteric, conjunctiva pink, lids and lashes normal. PERRL.    ENT: No nasal deformity,  discharge, epistaxis.  Hearing normal. OP moist without lesions.   Neck: No neck masses.  Trachea midline.  No thyromegaly/tenderness. Lymph: No cervical or supraclavicular lymphadenopathy. Skin: Warm and dry.  No jaundice.  No suspicious rashes or lesions. Cardiac: RRR, nl S1-S2, no murmurs appreciated.  Capillary refill is brisk.  JVP normal.  Mild left leg edema.  Radial and DP pulses 2+ and symmetric. Respiratory: Normal respiratory rate and rhythm.  CTAB without rales or wheezes. Abdomen: Abdomen soft.  Mild distension, soft, without TTP or guarding or rebound.  MSK: No deformities or effusions.  No cyanosis or clubbing. Neuro: Cranial nerves normal.  Sensation intact to light touch. Speech is fluent.  Muscle strength normal.    Psych: Sensorium intact and responding to questions, attention normal.  Behavior appropriate.  Affect normal.  Judgment and insight appear normal.     Labs on Admission:  I have personally reviewed following labs and imaging studies: CBC:  Recent Labs Lab 10/10/16 1733  WBC 16.6*  HGB 12.9*  HCT 39.4  MCV 85.8  PLT 123456*   Basic Metabolic Panel:  Recent Labs Lab 10/10/16 1733  NA 135  K 3.7  CL 95*  CO2 27  GLUCOSE 128*  BUN 19  CREATININE 1.27*  CALCIUM 10.3   GFR: Estimated Creatinine Clearance: 44.3 mL/min (by C-G formula based on SCr of 1.27 mg/dL (H)).  Liver Function Tests:  Recent Labs Lab 10/10/16 1733  AST 32  ALT 50  ALKPHOS 119  BILITOT 0.4  PROT 7.1  ALBUMIN 3.9    Recent Labs Lab 10/10/16 1733  LIPASE 43   No results for input(s): AMMONIA in the last 168 hours. Coagulation Profile:  Recent Labs Lab 10/10/16 2137  INR 1.20   Cardiac Enzymes: No results for input(s): CKTOTAL, CKMB, CKMBINDEX, TROPONINI in the last 168 hours. BNP (last 3 results) No results for input(s): PROBNP in the last 8760 hours. HbA1C: No results for input(s): HGBA1C in the last 72 hours. CBG: No results for input(s): GLUCAP in  the last 168 hours. Lipid Profile: No results for input(s): CHOL, HDL, LDLCALC, TRIG, CHOLHDL, LDLDIRECT in the last 72 hours. Thyroid Function Tests: No results for input(s): TSH, T4TOTAL, FREET4, T3FREE, THYROIDAB in the last 72 hours. Anemia Panel: No results for input(s): VITAMINB12, FOLATE, FERRITIN, TIBC, IRON, RETICCTPCT in the last 72 hours. Sepsis Labs: Lactic acid normal Invalid input(s): PROCALCITONIN, LACTICIDVEN No results found for this or any previous visit (from the past 240 hour(s)).       Radiological Exams on Admission: Personally reviewed: Ct Chest W Contrast  Result Date: 10/11/2016 CLINICAL DATA:  Abdominal pain and distension, nausea and vomiting beginning on October 02, 2016. Leukocytosis. History of RIGHT inguinal hernia repair, hyperlipidemia. EXAM: CT CHEST, ABDOMEN, AND PELVIS WITH CONTRAST TECHNIQUE: Multidetector CT imaging of the chest, abdomen and pelvis was  performed following the standard protocol during bolus administration of intravenous contrast. CONTRAST:  100 cc Isovue 300 COMPARISON:  CT abdomen and pelvis September 28, 2016. FINDINGS: CT CHEST FINDINGS CARDIOVASCULAR: Aneurysmal ascending aorta at 4.2 cm with moderate calcific atherosclerosis. Heart size is normal. Mild coronary artery calcifications, annular calcifications. No pericardial effusion. MEDIASTINUM/NODES: No mediastinal mass. No lymphadenopathy by CT size criteria. Normal appearance of thoracic esophagus though not tailored for evaluation. LUNGS/PLEURA: 3 mm ground-glass pulmonary nodule RIGHT middle lobe. Spiculated solid 9 mm RIGHT lower lobe pulmonary nodule (series 4, image 58/125). Small RIGHT pleural effusion. Tracheobronchial tree is patent, mild bronchial wall thickening. MUSCULOSKELETAL: Included soft tissues and included osseous structures appear normal. CT ABDOMEN AND PELVIS FINDINGS HEPATOBILIARY: Calcified granuloma of liver, minimal biliary dilatation. Contracted gallbladder.  PANCREAS: Normal. SPLEEN: Normal. ADRENALS/URINARY TRACT: Kidneys are orthotopic, persistently delayed LEFT nephrogram. No nephrolithiasis, hydronephrosis or solid renal masses. Mild to moderate bilateral hydroureteronephrosis. Normal contrast excretion RIGHT kidney on delayed, non on the LEFT. Urinary bladder is mild eccentric posterior bladder wall thickening up to 7 mm. No intravesicular calculi. Normal adrenal glands. STOMACH/BOWEL: Markedly fluid distended stomach. Moderate colonic diverticulosis. Moderate amount of retained large bowel stool. The small bowel are normal in course and caliber without inflammatory changes. Persistent fat stranding pancreas and duodenum sweep. Normal appendix. VASCULAR/LYMPHATIC: Chronic infrarenal aortic dissection in ectasia measuring up to 2.7 cm. No lymphadenopathy by CT size criteria. Mesenteric edema. Mildly thickened omentum. REPRODUCTIVE: Prostate is enlarged, 5 cm in transaxial dimension invading the base the urinary bladder. OTHER: Increasing moderate amount of ascites. No intraperitoneal free air. MUSCULOSKELETAL: Non-acute. Small fat containing inguinal hernia. Subcentimeter sclerotic focus RIGHT iliac bone, a additional smaller sclerotic foci in the pelvis. Osteopenia. IMPRESSION: CT CHEST: Mild bronchial wall thickening can be seen with reactive airway disease or bronchitis without pneumonia. **An incidental finding of potential clinical significance has been found. 4.2 cm aneurysmal ascending aorta. Recommend annual imaging followup by CTA or MRA. This recommendation follows 2010 ACCF/AHA/AATS/ACR/ASA/SCA/SCAI/SIR/STS/SVM Guidelines for the Diagnosis and Management of Patients with Thoracic Aortic Disease. Circulation. 2010; [id number]** **An incidental finding of potential clinical significance has been found. 9 mm spiculated solid RIGHT lower lobe pulmonary nodule. Consider PET CT, CT or tissue sampling at 3 months.** CT ABDOMEN AND PELVIS: Markedly distended  stomach concerning for gastric outlet obstruction. Persistent fat stranding about the head of the pancreas and duodenum sweep, possible duodenitis. Consider endoscopy. Mild to moderate bilateral hydroureteronephrosis and chronically delayed LEFT renal function. Eccentrically thickened urinary bladder, recommend cystoscopy. Increasing small to moderate amount of ascites and mesenteric edema. Mildly thickened omentum, which may be reactive though, associated with neoplasm. Electronically Signed   By: Elon Alas M.D.   On: 10/11/2016 02:52   Ct Abdomen Pelvis W Contrast  Result Date: 10/11/2016 CLINICAL DATA:  Abdominal pain and distension, nausea and vomiting beginning on October 02, 2016. Leukocytosis. History of RIGHT inguinal hernia repair, hyperlipidemia. EXAM: CT CHEST, ABDOMEN, AND PELVIS WITH CONTRAST TECHNIQUE: Multidetector CT imaging of the chest, abdomen and pelvis was performed following the standard protocol during bolus administration of intravenous contrast. CONTRAST:  100 cc Isovue 300 COMPARISON:  CT abdomen and pelvis September 28, 2016. FINDINGS: CT CHEST FINDINGS CARDIOVASCULAR: Aneurysmal ascending aorta at 4.2 cm with moderate calcific atherosclerosis. Heart size is normal. Mild coronary artery calcifications, annular calcifications. No pericardial effusion. MEDIASTINUM/NODES: No mediastinal mass. No lymphadenopathy by CT size criteria. Normal appearance of thoracic esophagus though not tailored for evaluation. LUNGS/PLEURA: 3 mm ground-glass pulmonary nodule  RIGHT middle lobe. Spiculated solid 9 mm RIGHT lower lobe pulmonary nodule (series 4, image 58/125). Small RIGHT pleural effusion. Tracheobronchial tree is patent, mild bronchial wall thickening. MUSCULOSKELETAL: Included soft tissues and included osseous structures appear normal. CT ABDOMEN AND PELVIS FINDINGS HEPATOBILIARY: Calcified granuloma of liver, minimal biliary dilatation. Contracted gallbladder. PANCREAS: Normal. SPLEEN:  Normal. ADRENALS/URINARY TRACT: Kidneys are orthotopic, persistently delayed LEFT nephrogram. No nephrolithiasis, hydronephrosis or solid renal masses. Mild to moderate bilateral hydroureteronephrosis. Normal contrast excretion RIGHT kidney on delayed, non on the LEFT. Urinary bladder is mild eccentric posterior bladder wall thickening up to 7 mm. No intravesicular calculi. Normal adrenal glands. STOMACH/BOWEL: Markedly fluid distended stomach. Moderate colonic diverticulosis. Moderate amount of retained large bowel stool. The small bowel are normal in course and caliber without inflammatory changes. Persistent fat stranding pancreas and duodenum sweep. Normal appendix. VASCULAR/LYMPHATIC: Chronic infrarenal aortic dissection in ectasia measuring up to 2.7 cm. No lymphadenopathy by CT size criteria. Mesenteric edema. Mildly thickened omentum. REPRODUCTIVE: Prostate is enlarged, 5 cm in transaxial dimension invading the base the urinary bladder. OTHER: Increasing moderate amount of ascites. No intraperitoneal free air. MUSCULOSKELETAL: Non-acute. Small fat containing inguinal hernia. Subcentimeter sclerotic focus RIGHT iliac bone, a additional smaller sclerotic foci in the pelvis. Osteopenia. IMPRESSION: CT CHEST: Mild bronchial wall thickening can be seen with reactive airway disease or bronchitis without pneumonia. **An incidental finding of potential clinical significance has been found. 4.2 cm aneurysmal ascending aorta. Recommend annual imaging followup by CTA or MRA. This recommendation follows 2010 ACCF/AHA/AATS/ACR/ASA/SCA/SCAI/SIR/STS/SVM Guidelines for the Diagnosis and Management of Patients with Thoracic Aortic Disease. Circulation. 2010; [id number]** **An incidental finding of potential clinical significance has been found. 9 mm spiculated solid RIGHT lower lobe pulmonary nodule. Consider PET CT, CT or tissue sampling at 3 months.** CT ABDOMEN AND PELVIS: Markedly distended stomach concerning for  gastric outlet obstruction. Persistent fat stranding about the head of the pancreas and duodenum sweep, possible duodenitis. Consider endoscopy. Mild to moderate bilateral hydroureteronephrosis and chronically delayed LEFT renal function. Eccentrically thickened urinary bladder, recommend cystoscopy. Increasing small to moderate amount of ascites and mesenteric edema. Mildly thickened omentum, which may be reactive though, associated with neoplasm. Electronically Signed   By: Elon Alas M.D.   On: 10/11/2016 02:52        Assessment/Plan  1. Gastric outlet obstruction:  Unclear etiology on imaging.     -NPO and MIVF -Consult to GI, appreciate cares -Ondansetron for nausea, hydromorphone for pain -Trend CMP -Famotidine IV daily   2. CKD:  Baseline Cr 1.3.  Stable.  3. Thoracic aneurysm:  Incidental finding. -Repeat CT in one year  4. Spiculated lung mass:  Indicental finding.  Pending work up of gastric outlet obstruction will need: biopsy, repeat CT or PET in 3 months  5. Leg swelling:  New.   -Obtain LE doppler US     DVT prophylaxis: SCDs  Code Status: FULL  Family Communication: Daughter at bedside  Disposition Plan: Anticipate NPO and MIVF and consult to GI for workup of gstric outlet obstruction. Consults called: None overnight Admission status: OBS At the point of initial evaluation, it is my clinical opinion that admission for OBSERVATION is reasonable and necessary because the patient's presenting complaints in the context of their chronic conditions represent sufficient risk of deterioration or significant morbidity to constitute reasonable grounds for close observation in the hospital setting, but that the patient may be medically stable for discharge from the hospital within 24 to 48 hours.  Medical decision making: Patient seen at 5:30 AM on 10/11/2016.  The patient was discussed with Dr. Regenia Skeeter.  What exists of the patient's chart was reviewed in depth  and summarized above.  Clinical condition: stable.        Edwin Dada [facility] Pager [phone]

## 2016-10-11 NOTE — Progress Notes (Signed)
Patient had 3 large bowel movements after SMOG enema. Patient abdomen soft. Will continue to monitor.

## 2016-10-11 NOTE — Progress Notes (Addendum)
   Follow Up Note  HPI: 83 year old male with past history significant for hyperlipidemia and stage II chronic kidney disease admitted for one week of abdominal distention with vomiting. CT scan noted gastric outlet obstruction and lab work noted acute kidney injury along with white blood cell count of 16. He was also found to have an incidental spiculated lung mass as well as thickening of the bladder, both suspicious for malignancy Pt admitted earlier this morning.  Seen after arrived to floor prior to EGD done by GI.  He has no complaints  Exam: CV: Regular rate and rhythm, S1-S2 Lungs: Clear to auscultation bilaterally Abd: Soft, nontender, nondistended, positive bowel sounds Ext: No clubbing or cyanosis or edema  Present on Admission: . Gastric outlet obstruction: Seen by GI and underwent endoscopy. No obstruction noted only esophagitis and gastric ulcer. . Acute kidney injury in the setting of CKD (chronic kidney disease), stage II: Hydrating, repeat labs the morning . Leukocytosis: Unclear etiology. Stress margination versus infection. Monitor for fever. Need to rule out aspiration, although at this time clinically stable . Left leg swelling: Negative lower extremity Doppler . Acute pylorus ulcer: On PPI. GI to follow-up. 9 mm spiculated right lower lobe lung nodule: We will discuss with patient in the morning. Outpatient PET scan versus biopsy with CT at 3 months. Enlarged aortic aneurysm: At 4.2 cm. Repeat CT in 1 year.  Eccentrically thickened urinary bladder: Patient has no problems urinating. Outpatient follow-up with urology for cystoscopy  Disposition: Potential discharge tomorrow depending on symptoms and decisions for lung mass workup plus improvement in labs

## 2016-10-11 NOTE — Progress Notes (Signed)
Patient went down to Endo for upper endoscopy.

## 2016-10-11 NOTE — Consult Note (Signed)
Consultation  Referring Provider:  Dr. Maryland Pink    Primary Care Physician:  Jerlyn Ly, MD Primary Gastroenterologist:  Dr. Henrene Pastor       Reason for Consultation: Gastric Outlet Obstruction        Impression / Plan:    Impression: 1. Gastric outlet obstruction: Patient with seven-day history of nausea and vomiting, inability to tolerate solids, decreasing bowel movements, increasing abdominal distention and discomfort, CT as above; consider duodenitis versus mass versus other 2. Nausea and vomiting: See above 3. Abdominal bloating: See above 4. Abnormal CT of the chest and abdomen: See above with multiple concerning findings including possible lung mass, thickened bladder wall and increased size of aortic aneurysm, see above imaging for complete findings 5. Leukocytosis: Consider relation to GOO   Plan: 1. Will discuss possible NG tube with Dr. Carlean Purl, though currently patient is not nauseous or vomiting, he does have marked abdominal distention 2. Discussed multiple abnormal findings with the patient's son today. He tells me he does not believe that his father has been told of the possibilities of cancer, he tells me that he would like [place] to wait to explain all findings to his father after resolution of his acute gastric outlet obstruction 3. Patient would benefit from an EGD. Discussed risks, benefits, limitations and alternatives the patient agrees to proceed. This was scheduled with Dr. Carlean Purl at 3:30 this afternoon 4. Patient to remain nothing by mouth 5. Patient will likely need repeat imaging of lung mass/further evaluation with biopsy 6. Patient would likely benefit from cystoscopy for further evaluation of bladder wall thickening as well as hydronephrosis, patient had previously been referred to nephrologist from our clinic, appointment is not until February and pt does complain of decreased frequency of urination 7. Please await any further recommendations from Dr.  Carlean Purl  Thank you for your kind consultation, we will continue to follow.  [name]  10/11/2016, 10:21 AM Pager #: [phone]    [facility] Attending   I have taken an interval history, reviewed the chart and examined the patient. I agree with the Advanced Practitioner's note, impression and recommendations.    He has a gastric outlet obstruction and some of the findings raise ? Of malignancy.  EGD today.  Reviewed w/ patient and son  Gatha Mayer, MD, [facility] [phone] (pager) [phone] after 5 PM, weekends and holidays  10/11/2016 3:18 PM         HPI:   Nicholas Diaz is a 83 y.o. Caucasian male the past medical history of hyperlipidemia, who presented to the ER on 10/12/15 after seeing his primary care provider on 10/11/15 for a complaint of worsening nausea and vomiting as well as labs showing an increasing white blood cell count. When he arrived to the ER he complained of 7 days of nausea and vomiting and a decreased oral intake as well as increasing abdominal distention.   Patient was previously seen in our clinic by myself on 09/27/16 for constipation and abdominal bloating symptoms. At that time the patient had been trying a mixture of laxatives which were not helping him to have daily bowel movements. He had described an abrupt change over the past month from 2 bowel movements a day like clockwork towards constipation. At that time, patient had labs that showed a white blood cell count minimally elevated at 11.1 as well as a hemoglobin minimally decreased at 12.6. CMP showed a glucose high at 128 was otherwise normal. TSH was  normal. At that time a CT the abdomen and pelvis were ordered and the patient was told to use MiraLAX 1-4 times daily titrated to a soft formed bowel movement as well as increase fiber, water and exercise.   CT of the abdomen pelvis completed 09/28/16 showed a small perihepatic and trace. Stomach ascites. Small  ascites noted in left pericolic gutters. Small pelvic ascites was noted. Subtle mild stranding of the fat in the region of the pancreatic head and medial to duodenum. Subtle mild pancreatitis or duodenitis cannot be excluded. There was also mild decreased enhancement delayed excretion of the left kidney with early obstructive uropathy not excluded. Mild left hydronephrosis and proximal left hydroureter. Mild left perinephric stranding. There was moderate gastric distention with some contrast and debris. There was no gastric outlet obstruction. There was moderate stool noted within the cecum. Osteopenia and degenerative changes of lumbar spine any focal ectatic abdominal aorta measuring 2.7 cm in diameter this is thought to be a probable sequela from prior aortic wall dissection.   Today, the patient tells me that since he was seen in our clinic last he was able to have daily bowel movements with the help of MiraLAX, in fact he had started to have some diarrhea on Monday of this week and had stopped the MiraLAX. He does tell me that his nausea and vomiting have worsened. He vomited at least once or twice a day over the past 7-9 days. He has not been able to take in any solid food and even some of the liquid foods like pudding have been "coming back up". In fact, since the patient has been here he has vomited twice, both times after eating. He tells me this very moment he does not have any nausea and typically has nausea and vomiting only brought on when he tries to eat. He reports his last bowel movements was 2 small liquid bowel movements on Monday. As above the patient tells me he recently went to see his PCP due to increase in nausea and vomiting and was told to come to the ER. Associated symptoms included a weight loss of 15 pounds over the past 2 months. Patient complains of bloating and generalized abdominal discomfort.   Patient's social history is positive for having a very concerned son and daughter.  Patient is an avid golfer and has not been able to hit the course in months due to discomforts.   Patient denies fever, chills, blood in his stool, melena, shortness of breath, anorexia, dysphagia.  ER course: Repeat CT of the abdomen and chest showed mild bronchial wall thickening, incidental finding of potential fluid medical significance of 4.2 cm aneurysmal ascending aorta, incidental finding of potential clinical significance of the 9 mm spiculated solid right lower lobe pulmonary nodule with recommendations for repeat CT/PET scan or tissue sampling in 3 months, markedly distended stomach concerning for gastric outlet obstruction with persistent fat stranding about the head of the pancreas and duodenum sweep, possible duodenitis, mild to moderate bilateral hydroureteronephrosis and chronically delayed left renal function, etc. [place] thickened urinary bladder with recommendations for cystoscopy,and increasing small to moderate amount of ascites and mesenteric edema, mildly thickened omentum which was thought to be reactive.  CBC showed an elevated white count of 16.6 compared to 11.1 two weeks ago and a hemoglobin increased to 12.9 from 12.6 two weeks ago. CMP showed a creatinine of 1.27, glucose of 128 as well as chloride low at 95, lipase was normal.  Prior GI history: Patient  has seen Dr. Henrene Pastor in the past for screening colonoscopy in 2007. This showed 3 tubular adenomas and 1 hyperplastic polyp.  Past Medical History:  Diagnosis Date  . Hyperlipidemia     Past Surgical History:  Procedure Laterality Date  . HERNIA REPAIR Right     Family History  Problem Relation Age of Onset  . Colon cancer Paternal Grandmother     Social History  Substance Use Topics  . Smoking status: Former Smoker    Types: Cigarettes, Pipe    Quit date: 10/09/1978  . Smokeless tobacco: Never Used  . Alcohol use No    Prior to Admission medications   Medication Sig Start Date End Date Taking?  Authorizing Provider  aspirin EC 81 MG tablet Take 81 mg by mouth daily.    Yes Historical Provider, MD  Cholecalciferol (VITAMIN D-1000 MAX ST) 1000 units tablet Take 1,000 Units by mouth daily.    Yes Historical Provider, MD  ezetimibe-simvastatin (VYTORIN) 10-10 MG per tablet Take 1 tablet by mouth at bedtime.   Yes Historical Provider, MD  Multiple Vitamin (MULTI-VITAMINS) TABS Take 1 tablet by mouth daily.    Yes Historical Provider, MD  Omega-3 Fatty Acids (FISH OIL PO) Take 1 capsule by mouth daily.    Yes Historical Provider, MD  omeprazole (PRILOSEC) 40 MG capsule Take 40 mg by mouth daily.   Yes Historical Provider, MD  polyvinyl alcohol (LIQUIFILM TEARS) 1.4 % ophthalmic solution Place 1 drop into both eyes daily as needed for dry eyes    Historical Provider, MD    Current Facility-Administered Medications  Medication Dose Route Frequency Provider Last Rate Last Dose  . 0.9 %  sodium chloride infusion   Intravenous Continuous Edwin Dada, MD 125 mL/hr at 10/11/16 0807 1,000 mL at 10/11/16 0807  . famotidine (PEPCID) IVPB 20 mg premix  20 mg Intravenous Q24H Edwin Dada, MD   20 mg at 10/11/16 0921  . HYDROmorphone (DILAUDID) injection 0.5-1 mg  0.5-1 mg Intravenous Q4H PRN Edwin Dada, MD      . ondansetron (ZOFRAN) tablet 4 mg  4 mg Oral Q6H PRN Edwin Dada, MD       Or  . ondansetron (ZOFRAN) injection 4 mg  4 mg Intravenous Q6H PRN Edwin Dada, MD   4 mg at 10/11/16 0622    Allergies as of 10/10/2016 - Review Complete 10/10/2016  Allergen Reaction Noted  . Ivp dye [iodinated diagnostic agents]  06/15/2012     Review of Systems:     Constitutional: Positive for weight loss of 15 pounds in 2 months No fever or chills HEENT: Eyes: No change in vision               Ears, Nose, Throat:  No change in hearing  Skin: No rash Cardiovascular: No chest pain Respiratory: No SOB Gastrointestinal: See HPI and otherwise  negative Genitourinary: Decreased urinary frequency No dysuria  Neurological: No headache, dizziness or syncope Musculoskeletal: Positive for right pedal edema No new muscle or joint pain Hematologic: No bleeding Psychiatric: No history of depression or anxiety   Physical Exam:  Vital signs in last 24 hours: Temp:  [98.3 F (36.8 C)-98.7 F (37.1 C)] 98.3 F (36.8 C) (01/03 0745) Pulse Rate:  [95-111] 109 (01/03 0745) Resp:  [13-25] 18 (01/03 0745) BP: (88-164)/(59-95) 164/90 (01/03 0745) SpO2:  [88 %-98 %] 95 % (01/03 0745) Weight:  [195 lb (88.5 kg)] 195 lb (88.5 kg) (01/03 0745) Last BM  Date: 10/10/16 General:   Pleasant Elderly Caucasian male appears to be in NAD, Well developed, Well nourished, alert and cooperative Head:  Normocephalic and atraumatic. Eyes:   PEERL, EOMI. No icterus. Conjunctiva pink. Ears:  Normal auditory acuity. Neck:  Supple Throat: Oral cavity and pharynx without inflammation, swelling or lesion.  Lungs: Respirations even and unlabored. Lungs clear to auscultation bilaterally.   No wheezes, crackles, or rhonchi.  Heart: Normal S1, S2. No MRG. Regular rate and rhythm.  Abdomen:  Tense, marked distention, decreased bowel sounds in all 4 quadrants, mild generalized TTP No rebound or guarding. No appreciable masses or hepatomegaly. Rectal:  Not performed.  Msk:  Symmetrical without gross deformities. Peripheral pulses intact.  Extremities:  Right lower leg edema 2+ to the level of the shin No deformity or joint abnormality. Normal ROM, normal sensation. Neurologic:  Alert and  oriented x4;  grossly normal neurologically.  Skin:   Dry and intact without significant lesions or rashes. Psychiatric: Demonstrates good judgement and reason without abnormal affect or behaviors.  LAB RESULTS:  Recent Labs  10/10/16 1733  WBC 16.6*  HGB 12.9*  HCT 39.4  PLT 416*   BMET  Recent Labs  10/10/16 1733  NA 135  K 3.7  CL 95*  CO2 27  GLUCOSE 128*  BUN  19  CREATININE 1.27*  CALCIUM 10.3   LFT  Recent Labs  10/10/16 1733  PROT 7.1  ALBUMIN 3.9  AST 32  ALT 50  ALKPHOS 119  BILITOT 0.4   PT/INR  Recent Labs  10/10/16 2137  LABPROT 15.3*  INR 1.20    STUDIES: Ct Chest W Contrast  Result Date: 10/11/2016 CLINICAL DATA:  Abdominal pain and distension, nausea and vomiting beginning on October 02, 2016. Leukocytosis. History of RIGHT inguinal hernia repair, hyperlipidemia. EXAM: CT CHEST, ABDOMEN, AND PELVIS WITH CONTRAST TECHNIQUE: Multidetector CT imaging of the chest, abdomen and pelvis was performed following the standard protocol during bolus administration of intravenous contrast. CONTRAST:  100 cc Isovue 300 COMPARISON:  CT abdomen and pelvis September 28, 2016. FINDINGS: CT CHEST FINDINGS CARDIOVASCULAR: Aneurysmal ascending aorta at 4.2 cm with moderate calcific atherosclerosis. Heart size is normal. Mild coronary artery calcifications, annular calcifications. No pericardial effusion. MEDIASTINUM/NODES: No mediastinal mass. No lymphadenopathy by CT size criteria. Normal appearance of thoracic esophagus though not tailored for evaluation. LUNGS/PLEURA: 3 mm ground-glass pulmonary nodule RIGHT middle lobe. Spiculated solid 9 mm RIGHT lower lobe pulmonary nodule (series 4, image 58/125). Small RIGHT pleural effusion. Tracheobronchial tree is patent, mild bronchial wall thickening. MUSCULOSKELETAL: Included soft tissues and included osseous structures appear normal. CT ABDOMEN AND PELVIS FINDINGS HEPATOBILIARY: Calcified granuloma of liver, minimal biliary dilatation. Contracted gallbladder. PANCREAS: Normal. SPLEEN: Normal. ADRENALS/URINARY TRACT: Kidneys are orthotopic, persistently delayed LEFT nephrogram. No nephrolithiasis, hydronephrosis or solid renal masses. Mild to moderate bilateral hydroureteronephrosis. Normal contrast excretion RIGHT kidney on delayed, non on the LEFT. Urinary bladder is mild eccentric posterior bladder wall  thickening up to 7 mm. No intravesicular calculi. Normal adrenal glands. STOMACH/BOWEL: Markedly fluid distended stomach. Moderate colonic diverticulosis. Moderate amount of retained large bowel stool. The small bowel are normal in course and caliber without inflammatory changes. Persistent fat stranding pancreas and duodenum sweep. Normal appendix. VASCULAR/LYMPHATIC: Chronic infrarenal aortic dissection in ectasia measuring up to 2.7 cm. No lymphadenopathy by CT size criteria. Mesenteric edema. Mildly thickened omentum. REPRODUCTIVE: Prostate is enlarged, 5 cm in transaxial dimension invading the base the urinary bladder. OTHER: Increasing moderate amount of ascites. No intraperitoneal  free air. MUSCULOSKELETAL: Non-acute. Small fat containing inguinal hernia. Subcentimeter sclerotic focus RIGHT iliac bone, a additional smaller sclerotic foci in the pelvis. Osteopenia. IMPRESSION: CT CHEST: Mild bronchial wall thickening can be seen with reactive airway disease or bronchitis without pneumonia. **An incidental finding of potential clinical significance has been found. 4.2 cm aneurysmal ascending aorta. Recommend annual imaging followup by CTA or MRA. This recommendation follows 2010 ACCF/AHA/AATS/ACR/ASA/SCA/SCAI/SIR/STS/SVM Guidelines for the Diagnosis and Management of Patients with Thoracic Aortic Disease. Circulation. 2010; [id number]** **An incidental finding of potential clinical significance has been found. 9 mm spiculated solid RIGHT lower lobe pulmonary nodule. Consider PET CT, CT or tissue sampling at 3 months.** CT ABDOMEN AND PELVIS: Markedly distended stomach concerning for gastric outlet obstruction. Persistent fat stranding about the head of the pancreas and duodenum sweep, possible duodenitis. Consider endoscopy. Mild to moderate bilateral hydroureteronephrosis and chronically delayed LEFT renal function. Eccentrically thickened urinary bladder, recommend cystoscopy. Increasing small to moderate  amount of ascites and mesenteric edema. Mildly thickened omentum, which may be reactive though, associated with neoplasm. Electronically Signed   By: Elon Alas M.D.   On: 10/11/2016 02:52   Ct Abdomen Pelvis W Contrast  Result Date: 10/11/2016 CLINICAL DATA:  Abdominal pain and distension, nausea and vomiting beginning on October 02, 2016. Leukocytosis. History of RIGHT inguinal hernia repair, hyperlipidemia. EXAM: CT CHEST, ABDOMEN, AND PELVIS WITH CONTRAST TECHNIQUE: Multidetector CT imaging of the chest, abdomen and pelvis was performed following the standard protocol during bolus administration of intravenous contrast. CONTRAST:  100 cc Isovue 300 COMPARISON:  CT abdomen and pelvis September 28, 2016. FINDINGS: CT CHEST FINDINGS CARDIOVASCULAR: Aneurysmal ascending aorta at 4.2 cm with moderate calcific atherosclerosis. Heart size is normal. Mild coronary artery calcifications, annular calcifications. No pericardial effusion. MEDIASTINUM/NODES: No mediastinal mass. No lymphadenopathy by CT size criteria. Normal appearance of thoracic esophagus though not tailored for evaluation. LUNGS/PLEURA: 3 mm ground-glass pulmonary nodule RIGHT middle lobe. Spiculated solid 9 mm RIGHT lower lobe pulmonary nodule (series 4, image 58/125). Small RIGHT pleural effusion. Tracheobronchial tree is patent, mild bronchial wall thickening. MUSCULOSKELETAL: Included soft tissues and included osseous structures appear normal. CT ABDOMEN AND PELVIS FINDINGS HEPATOBILIARY: Calcified granuloma of liver, minimal biliary dilatation. Contracted gallbladder. PANCREAS: Normal. SPLEEN: Normal. ADRENALS/URINARY TRACT: Kidneys are orthotopic, persistently delayed LEFT nephrogram. No nephrolithiasis, hydronephrosis or solid renal masses. Mild to moderate bilateral hydroureteronephrosis. Normal contrast excretion RIGHT kidney on delayed, non on the LEFT. Urinary bladder is mild eccentric posterior bladder wall thickening up to 7 mm. No  intravesicular calculi. Normal adrenal glands. STOMACH/BOWEL: Markedly fluid distended stomach. Moderate colonic diverticulosis. Moderate amount of retained large bowel stool. The small bowel are normal in course and caliber without inflammatory changes. Persistent fat stranding pancreas and duodenum sweep. Normal appendix. VASCULAR/LYMPHATIC: Chronic infrarenal aortic dissection in ectasia measuring up to 2.7 cm. No lymphadenopathy by CT size criteria. Mesenteric edema. Mildly thickened omentum. REPRODUCTIVE: Prostate is enlarged, 5 cm in transaxial dimension invading the base the urinary bladder. OTHER: Increasing moderate amount of ascites. No intraperitoneal free air. MUSCULOSKELETAL: Non-acute. Small fat containing inguinal hernia. Subcentimeter sclerotic focus RIGHT iliac bone, a additional smaller sclerotic foci in the pelvis. Osteopenia. IMPRESSION: CT CHEST: Mild bronchial wall thickening can be seen with reactive airway disease or bronchitis without pneumonia. **An incidental finding of potential clinical significance has been found. 4.2 cm aneurysmal ascending aorta. Recommend annual imaging followup by CTA or MRA. This recommendation follows 2010 ACCF/AHA/AATS/ACR/ASA/SCA/SCAI/SIR/STS/SVM Guidelines for the Diagnosis and Management of Patients with Thoracic  Aortic Disease. Circulation. 2010; [id number]** **An incidental finding of potential clinical significance has been found. 9 mm spiculated solid RIGHT lower lobe pulmonary nodule. Consider PET CT, CT or tissue sampling at 3 months.** CT ABDOMEN AND PELVIS: Markedly distended stomach concerning for gastric outlet obstruction. Persistent fat stranding about the head of the pancreas and duodenum sweep, possible duodenitis. Consider endoscopy. Mild to moderate bilateral hydroureteronephrosis and chronically delayed LEFT renal function. Eccentrically thickened urinary bladder, recommend cystoscopy. Increasing small to moderate amount of ascites and  mesenteric edema. Mildly thickened omentum, which may be reactive though, associated with neoplasm. Electronically Signed   By: Elon Alas M.D.   On: 10/11/2016 02:52   CT ABDOMEN AND PELVIS WITH CONTRAST 09/28/16  TECHNIQUE: Multidetector CT imaging of the abdomen and pelvis was performed using the standard protocol following bolus administration of intravenous contrast.  CONTRAST:  78mL ISOVUE-300 IOPAMIDOL (ISOVUE-300) INJECTION 61%  COMPARISON:  None.  FINDINGS: Lower chest: Lung bases are normal. Small right pleural effusion with right base posterior atelectasis.  Hepatobiliary: There is mild fatty infiltration of the liver. Contracted gallbladder without evidence of calcified gallstones. Small perihepatic ascites is noted. No focal hepatic mass.  Pancreas: No focal pancreatic mass. There is subtle mild stranding of peripancreatic fat in the region of pancreatic head adjacent to distal duodenum. Subtle mild pancreatitis cannot be excluded. Subtle mild stranding of retroperitoneal and mesenteric fat just inferior to pancreas/ anterior to distal duodenum. Again mild inflammatory process duodenitis or pancreatitis cannot be excluded.  Spleen: Enhanced spleen is unremarkable.  Trace perisplenic ascites.  Adrenals/Urinary Tract: No adrenal gland mass. There is mild decreased enhancement of the left kidney. Mild left perinephric stranding. Mild left hydronephrosis. Delayed renal images shows mild delay excretion of the left kidney. Although findings may be due to obstructive uropathy/ureteral stricture inflammatory process or ureteral neoplastic process cannot be excluded. Correlation with urology exam is recommended. No nephrolithiasis. No calcified ureteral calculi are noted. Distal left ureter is small caliber.  Stomach/Bowel: No small bowel obstruction. There is a distended stomach with moderate debris. Mild gastric ileus or gastroparesis cannot be  excluded. No gastric outlet obstruction. Contrast material noted within distal small bowel loops. No distal colonic obstruction. Terminal ileum is unremarkable. Normal appendix is partially visualized. There is small ascites in left paracolic gutter. Nonspecific mild anterior mesenteric edema just inferior to transverse colon. Trace pelvic ascites is noted. Moderate stool noted within cecum. No pericecal inflammation. Moderate stool noted in right colon and transverse colon. Some colonic stool noted in descending colon.  Vascular/Lymphatic: There is focal ectatic abdominal aorta axial image 39 measures 2.7 cm in diameter. This is best seen in coronal image 67. Probable calcified Previous dissection flap at this level is noted axial image 39. This is probable sequela from prior aortic wall dissection. Atherosclerotic calcifications of abdominal aorta and iliac arteries are noted. Small nonspecific mesenteric lymph nodes are noted without mesenteric adenopathy.  Reproductive: Prostate gland and seminal vesicles are unremarkable.  Other: There is under distended urinary bladder. Mild nonspecific mild thickening of urinary bladder wall. Mild cystitis or inflammation cannot be excluded. No evidence of free abdominal air. Small lateral pelvic ascites. There is a small left inguinal scrotal canal hernia containing fat without evidence of acute complication measures 1.2 cm.  Musculoskeletal: No destructive bony lesions are noted. Sagittal images of the spine shows osteopenia and degenerative changes thoracolumbar spine.  IMPRESSION: 1. There is small perihepatic and trace perisplenic ascites. Small ascites noted in left paracolic  gutters. Small pelvic ascites is noted. 2. Subtle mild stranding of the fat in the region of pancreatic head and medial to duodenum. Subtle mild pancreatitis or duodenitis cannot be excluded. Clinical correlation is necessary. 3. There is mild decreased  enhancement and delay excretion of the left kidney. Early obstructive uropathy cannot be excluded. Mild left hydronephrosis and proximal left hydroureter. Mild left perinephric stranding. Ureteral stricture, inflammatory process or neoplastic process cannot be excluded. Correlation with urology exam is recommended. Nonspecific mild thickening of urinary bladder wall. No nephrolithiasis. 4. Moderate gastric distension with some contrast and debris. Gastroparesis or gastric ileus cannot be excluded. No gastric outlet obstruction. 5. No pericecal inflammation. Moderate stool noted within cecum. Normal appendix. 6. Osteopenia and degenerative changes lumbar spine. 7. There is focal ectatic abdominal aorta axial image 39 measures 2.7 cm in diameter. This is best seen in coronal image 67. Probable calcified Previous dissection flap at this level is noted axial image 39. This is probable sequela from prior aortic wall dissection.   Electronically Signed   By: Lahoma Crocker M.D.   On: 09/28/2016 12:58  PREVIOUS ENDOSCOPIES:            See HPI-[place] 2007

## 2016-10-11 NOTE — Op Note (Signed)
[facility] Patient Name: Nicholas Diaz Procedure Date: 10/11/2016 MRN: [id number] Attending MD: Gatha Mayer , MD Date of Birth: October 27, 1923 CSN: [id number] Age: 83 Admit Type: Inpatient Procedure:                Upper GI endoscopy Indications:              Abnormal CT of the GI tract, Persistent vomiting Providers:                Gatha Mayer, MD, Cleda Daub, RN, Cherylynn Ridges, Technician Referring MD:              Medicines:                Fentanyl 25 micrograms IV, Midazolam 2 mg IV,                            Cetacaine spray Complications:            No immediate complications. Estimated Blood Loss:     Estimated blood loss was minimal. Procedure:                Pre-Anesthesia Assessment:                           - Prior to the procedure, a History and Physical                            was performed, and patient medications and                            allergies were reviewed. The patient's tolerance of                            previous anesthesia was also reviewed. The risks                            and benefits of the procedure and the sedation                            options and risks were discussed with the patient.                            All questions were answered, and informed consent                            was obtained. Prior Anticoagulants: The patient                            last took aspirin 1 day prior to the procedure. ASA                            Grade Assessment: III - A patient with severe  systemic disease. After reviewing the risks and                            benefits, the patient was deemed in satisfactory                            condition to undergo the procedure.                           After obtaining informed consent, the endoscope was                            passed under direct vision. Throughout the                            procedure, the  patient's blood pressure, pulse, and                            oxygen saturations were monitored continuously. The                            [id number] scope was introduced through the                            mouth, and advanced to the second part of duodenum.                            The upper GI endoscopy was accomplished without                            difficulty. The patient tolerated the procedure                            well. Scope In: Scope Out: Findings:      One oozing cratered gastric ulcer with no stigmata of bleeding was found       at the pylorus. The lesion was 10 mm in largest dimension. Biopsies were       taken with a cold forceps for histology. Verification of patient       identification for the specimen was done. Estimated blood loss was       minimal.      Mildly severe esophagitis with no bleeding was found.      Bilious fluid was found in the stomach. Fluid aspiration was performed.       1500 cc removed - small amount and some fibrous debris remained in       proximal stomach - limited views some.      The exam was otherwise without abnormality.      The cardia and gastric fundus were normal on retroflexion. Impression:               - Oozing gastric ulcer with no stigmata of                            bleeding. Biopsied. It was soft and did not appear  malignant.                           - Mildly severe acute esophagitis.                           - Bilious gastric fluid. Fluid aspiration performed.                           - The examination was otherwise normal. Moderate Sedation:      Moderate (conscious) sedation was administered by the endoscopy nurse       and supervised by the endoscopist. The following parameters were       monitored: oxygen saturation, heart rate, blood pressure, respiratory       rate, EKG, adequacy of pulmonary ventilation, and response to care.       Total physician intraservice time  was 15 minutes. Recommendation:           - Return patient to hospital ward for ongoing care.                           - Clear liquid diet.                           - Use Protonix (pantoprazole) 40 mg IV daily                            [duration].                           - Await pathology - treat constipation with SMOG                            enema. Next steps pending this.                           - Continue present medications. Procedure Code(s):        --- Professional ---                           [phone], Esophagogastroduodenoscopy, flexible,                            transoral; with biopsy, single or multiple Diagnosis Code(s):        --- Professional ---                           K25.4, Chronic or unspecified gastric ulcer with                            hemorrhage                           R11.10, Vomiting, unspecified                           R93.3, Abnormal findings on diagnostic imaging of  other parts of digestive tract CPT copyright 2016 [employer]. All rights reserved. The codes documented in this report are preliminary and upon coder review may  be revised to meet current compliance requirements. Gatha Mayer, MD 10/11/2016 4:07:29 PM This report has been signed electronically. Number of Addenda: 0

## 2016-10-12 ENCOUNTER — Encounter (HOSPITAL_COMMUNITY): Payer: Self-pay | Admitting: Internal Medicine

## 2016-10-12 LAB — COMPREHENSIVE METABOLIC PANEL
ALBUMIN: 2.9 g/dL — AB (ref 3.5–5.0)
ALK PHOS: 84 U/L (ref 38–126)
ALT: 34 U/L (ref 17–63)
ANION GAP: 8 (ref 5–15)
AST: 28 U/L (ref 15–41)
BUN: 22 mg/dL — ABNORMAL HIGH (ref 6–20)
CO2: 27 mmol/L (ref 22–32)
Calcium: 8.8 mg/dL — ABNORMAL LOW (ref 8.9–10.3)
Chloride: 105 mmol/L (ref 101–111)
Creatinine, Ser: 1.31 mg/dL — ABNORMAL HIGH (ref 0.61–1.24)
GFR calc Af Amer: 54 mL/min — ABNORMAL LOW (ref >60)
GFR calc non Af Amer: 46 mL/min — ABNORMAL LOW (ref >60)
GLUCOSE: 92 mg/dL (ref 65–99)
POTASSIUM: 3.8 mmol/L (ref 3.5–5.1)
SODIUM: 140 mmol/L (ref 135–145)
Total Bilirubin: 0.5 mg/dL (ref 0.3–1.2)
Total Protein: 5.2 g/dL — ABNORMAL LOW (ref 6.5–8.1)

## 2016-10-12 LAB — CBC
HEMATOCRIT: 31.5 % — AB (ref 39.0–52.0)
HEMOGLOBIN: 10.2 g/dL — AB (ref 13.0–17.0)
MCH: 28.9 pg (ref 26.0–34.0)
MCHC: 32.4 g/dL (ref 30.0–36.0)
MCV: 89.2 fL (ref 78.0–100.0)
Platelets: 299 10*3/uL (ref 150–400)
RBC: 3.53 MIL/uL — ABNORMAL LOW (ref 4.22–5.81)
RDW: 12.8 % (ref 11.5–15.5)
WBC: 11.3 10*3/uL — ABNORMAL HIGH (ref 4.0–10.5)

## 2016-10-12 MED ORDER — POLYETHYLENE GLYCOL 3350 17 G PO PACK
17.0000 g | PACK | Freq: Two times a day (BID) | ORAL | Status: DC
  Administered 2016-10-12 – 2016-10-13 (×4): 17 g via ORAL
  Filled 2016-10-12 (×4): qty 1

## 2016-10-12 NOTE — Progress Notes (Signed)
Progress Note  Assessment / Plan:   Assessment: 1. Gastric outlet obstruction: Patient with EGD yesterday revealing esophagitis and a gastric ulcer as well as removal of 1500 mL of bilious gastric fluid; etiology of obstruction is still uncertain question perineoplastic process versus other 2. Nausea and vomiting: Resolved today 3. Abdominal bloating: Resolved today 4. Abnormal CT of the chest and abdomen:  Further workup with a PET scan outpatient/ possible biopsy being discussed-hospitalist following 5. Leukocytosis: Improving  Plan: 1. Continue clear liquid diet this morning and for lunch, will discuss full liquid diet for dinner with Dr. Carlean Purl 2. Patient and his family appreciate further recommendations regarding workup for abnormal CT 3. We will continue to follow during the patient's hospital stay 4. May consider outpatient colonoscopy 5. Patient had good production of stool with after smog enema yesterday, added MiraLAX twice a day to his regimen today, can decrease to once daily dosing if this causes loose stools 6. Please await any further recommendations from Dr. Carlean Purl    LOS: 1 day   Lavone Nian [facility]  10/12/2016, 9:54 AM  Pager # [phone]    [facility] Attending   I have taken an interval history, reviewed the chart and examined the patient. I agree with the Advanced Practitioner's note, impression and recommendations.   He is much better Not sure I can explain all of his sxs - he had hiccoughs (chrionic recurrent) and then nausea and vomiting. Might have had a gastroenteritis on top of ulcer. Would advance diet and let him go home tomorrow barring difficulties  Full liquids tonight and advance as tolerated  Will need outpt f/u GI and PCP  I think PET scan because of spiculated lung nodule is appropriate )outpt)   Gatha Mayer, MD, [facility] [phone] (pager) [phone] after 5 PM, weekends and holidays  10/12/2016 12:20  PM     Subjective  Chief Complaint:Gastric outlet obstruction  Patient is found sitting up on the side of his battery in his favor this morning surrounded by his son and daughter. He tells me that he feels "100% better". He describes that he has been declining in health over the past 6 weeks, rotated day feels better than he has in a long time. Patient does describe receiving 3 enemas last night with results of a large amount of bowel movement. He has had no further bowel movements since. He has been able to urinate a couple of times this morning which is been larger volume than previously. Overall he feels less distended and denies any bloating. He did tolerate a clear liquid diet this morning. His family does have some questions in regards to what could've caused the ulcer etc.    Objective   Vital signs in last 24 hours: Temp:  [98 F (36.7 C)-98.8 F (37.1 C)] 98 F (36.7 C) (01/04 0700) Pulse Rate:  [74-97] 74 (01/04 0700) Resp:  [13-21] 16 (01/04 0700) BP: (111-181)/(50-82) 125/64 (01/04 0700) SpO2:  [92 %-97 %] 97 % (01/04 0700) Weight:  [195 lb (88.5 kg)] 195 lb (88.5 kg) (01/03 1456) Last BM Date: 10/11/16 General: Caucasian male in NAD Heart:  Regular rate and rhythm; no murmurs Lungs: Respirations even and unlabored, lungs CTA bilaterally Abdomen:  Soft, Mild epigastric TTP and mild distention, decreased bowel sounds all 4 quadrants Extremities:  Without edema. Neurologic:  Alert and oriented,  grossly normal neurologically. Psych:  Cooperative. Normal mood and affect.  Intake/Output from previous day: 01/03 0701 - 01/04  0700 In: 814.6 [I.V.:764.6; IV Piggyback:50] Out: -   Lab Results:  Recent Labs  10/10/16 1733 10/11/16 1926 10/12/16 0359  WBC 16.6* 14.3* 11.3*  HGB 12.9* 10.9* 10.2*  HCT 39.4 33.4* 31.5*  PLT 416* 348 299   BMET  Recent Labs  10/10/16 1733 10/12/16 0359  NA 135 140  K 3.7 3.8  CL 95* 105  CO2 27 27  GLUCOSE 128* 92  BUN 19 22*   CREATININE 1.27* 1.31*  CALCIUM 10.3 8.8*   LFT  Recent Labs  10/12/16 0359  PROT 5.2*  ALBUMIN 2.9*  AST 28  ALT 34  ALKPHOS 84  BILITOT 0.5    Studies/Results:  EGD-10/11/16-Dr. Carlean Purl Findings:      One oozing cratered gastric ulcer with no stigmata of bleeding was found       at the pylorus. The lesion was 10 mm in largest dimension. Biopsies were       taken with a cold forceps for histology. Verification of patient       identification for the specimen was done. Estimated blood loss was       minimal.      Mildly severe esophagitis with no bleeding was found.      Bilious fluid was found in the stomach. Fluid aspiration was performed.       1500 cc removed - small amount and some fibrous debris remained in       proximal stomach - limited views some.      The exam was otherwise without abnormality.      The cardia and gastric fundus were normal on retroflexion. Impression:               - Oozing gastric ulcer with no stigmata of                            bleeding. Biopsied. It was soft and did not appear                            malignant.                           - Mildly severe acute esophagitis.                           - Bilious gastric fluid. Fluid aspiration performed.                           - The examination was otherwise normal. Recommendation:           - Return patient to hospital ward for ongoing care.                           - Clear liquid diet.                           - Use Protonix (pantoprazole) 40 mg IV daily                            [duration].                           -  Await pathology - treat constipation with SMOG                            enema. Next steps pending this.                           - Continue present medications.

## 2016-10-12 NOTE — Progress Notes (Signed)
PROGRESS NOTE  Nicholas Diaz [id number] DOB: Mar 10, 1924 DOA: 10/10/2016 PCP: Jerlyn Ly, MD  HPI/Recap of past 24 hours:  Sitting on the edge of bed, denies pain, report feeling better, Family in room  Assessment/Plan: Principal Problem:   Gastric outlet obstruction Active Problems:   CKD (chronic kidney disease), stage III   Leukocytosis   Left leg swelling   Acute pylorus ulcer   Persistent vomiting   AKI (acute kidney injury) ([place])   Gastric outlet obstruction/Acute pylorus ulcer:: on ppi, Seen by GI and underwent endoscopy. No obstruction noted only esophagitis and gastric ulcer. Diet advancement per GI. Marland Kitchen Acute kidney injury in the setting of CKD (chronic kidney disease), stage II: Hydrating, repeat labs the morning . Leukocytosis: Unclear etiology. Stress margination versus dehydration , so far no sign of  infection. Monitor for fever. Need to rule out aspiration, although at this time clinically stable . Left leg swelling: report chronic ,Negative lower extremity Doppler .9 mm spiculated right lower lobe lung nodule: Outpatient PET scan versus biopsy with CT at 3 months. Enlarged aortic aneurysm: At 4.2 cm. Repeat CT in 1 year.  Eccentrically thickened urinary bladder: Patient has no problems urinating. Outpatient follow-up with urology for cystoscopy     Code Status: full  Family Communication: patient ,son and daughter in law at bedside  Disposition Plan: home 1/5 with gi clearnace   Consultants:  GI  Procedures:  EGD  Antibiotics:  none   Objective: BP 125/64 (BP Location: Right Arm)   Pulse 74   Temp 98 F (36.7 C) (Oral)   Resp 16   Ht 5\' 9"  (1.753 m)   Wt 88.5 kg (195 lb)   SpO2 97%   BMI 28.80 kg/m   Intake/Output Summary (Last 24 hours) at 10/12/16 1017 Last data filed at 10/11/16 1414  Gross per 24 hour  Intake           814.58 ml  Output                0 ml  Net           814.58 ml   Filed Weights   10/11/16 0745  10/11/16 1456  Weight: 88.5 kg (195 lb) 88.5 kg (195 lb)    Exam:   General:  NAD  Cardiovascular: RRR  Respiratory: CTABL  Abdomen: Soft/ND/NT, positive BS  Musculoskeletal: left lower extremity pitting edema  Neuro: aaox3  Data Reviewed: Basic Metabolic Panel:  Recent Labs Lab 10/10/16 1733 10/12/16 0359  NA 135 140  K 3.7 3.8  CL 95* 105  CO2 27 27  GLUCOSE 128* 92  BUN 19 22*  CREATININE 1.27* 1.31*  CALCIUM 10.3 8.8*   Liver Function Tests:  Recent Labs Lab 10/10/16 1733 10/12/16 0359  AST 32 28  ALT 50 34  ALKPHOS 119 84  BILITOT 0.4 0.5  PROT 7.1 5.2*  ALBUMIN 3.9 2.9*    Recent Labs Lab 10/10/16 1733  LIPASE 43   No results for input(s): AMMONIA in the last 168 hours. CBC:  Recent Labs Lab 10/10/16 1733 10/11/16 1926 10/12/16 0359  WBC 16.6* 14.3* 11.3*  HGB 12.9* 10.9* 10.2*  HCT 39.4 33.4* 31.5*  MCV 85.8 88.1 89.2  PLT 416* 348 299   Cardiac Enzymes:   No results for input(s): CKTOTAL, CKMB, CKMBINDEX, TROPONINI in the last 168 hours. BNP (last 3 results) No results for input(s): BNP in the last 8760 hours.  ProBNP (last 3 results) No results for  input(s): PROBNP in the last 8760 hours.  CBG: No results for input(s): GLUCAP in the last 168 hours.  No results found for this or any previous visit (from the past 240 hour(s)).   Studies: No results found.  Scheduled Meds: . famotidine (PEPCID) IV  20 mg Intravenous Q24H  . mouth rinse  15 mL Mouth Rinse BID  . pantoprazole (PROTONIX) IV  40 mg Intravenous Q12H  . polyethylene glycol  17 g Oral BID    Continuous Infusions:   Time spent: 82mins  Walther Sanagustin MD, PhD  [facility] Pager [phone]. If 7PM-7AM, please contact night-coverage at [email or web address], password [facility] 10/12/2016, 10:17 AM  LOS: 1 day

## 2016-10-13 LAB — BASIC METABOLIC PANEL
ANION GAP: 8 (ref 5–15)
BUN: 16 mg/dL (ref 6–20)
CO2: 26 mmol/L (ref 22–32)
Calcium: 8.8 mg/dL — ABNORMAL LOW (ref 8.9–10.3)
Chloride: 102 mmol/L (ref 101–111)
Creatinine, Ser: 1.22 mg/dL (ref 0.61–1.24)
GFR calc Af Amer: 58 mL/min — ABNORMAL LOW (ref >60)
GFR calc non Af Amer: 50 mL/min — ABNORMAL LOW (ref >60)
GLUCOSE: 98 mg/dL (ref 65–99)
POTASSIUM: 3.8 mmol/L (ref 3.5–5.1)
Sodium: 136 mmol/L (ref 135–145)

## 2016-10-13 LAB — CBC
HEMATOCRIT: 33.6 % — AB (ref 39.0–52.0)
Hemoglobin: 10.9 g/dL — ABNORMAL LOW (ref 13.0–17.0)
MCH: 28.6 pg (ref 26.0–34.0)
MCHC: 32.4 g/dL (ref 30.0–36.0)
MCV: 88.2 fL (ref 78.0–100.0)
Platelets: 320 10*3/uL (ref 150–400)
RBC: 3.81 MIL/uL — ABNORMAL LOW (ref 4.22–5.81)
RDW: 12.7 % (ref 11.5–15.5)
WBC: 9.5 10*3/uL (ref 4.0–10.5)

## 2016-10-13 LAB — PSA: PSA: 1.02 ng/mL (ref 0.00–4.00)

## 2016-10-13 LAB — MAGNESIUM: Magnesium: 1.9 mg/dL (ref 1.7–2.4)

## 2016-10-13 LAB — URIC ACID: Uric Acid, Serum: 5.3 mg/dL (ref 4.4–7.6)

## 2016-10-13 MED ORDER — ENSURE ENLIVE PO LIQD
237.0000 mL | Freq: Three times a day (TID) | ORAL | Status: DC
  Administered 2016-10-13 (×2): 237 mL via ORAL

## 2016-10-13 MED ORDER — SENNOSIDES-DOCUSATE SODIUM 8.6-50 MG PO TABS
1.0000 | ORAL_TABLET | Freq: Two times a day (BID) | ORAL | Status: DC
  Administered 2016-10-13 (×2): 1 via ORAL
  Filled 2016-10-13 (×2): qty 1

## 2016-10-13 MED ORDER — POLYETHYLENE GLYCOL 3350 17 G PO PACK: 17.0000 g | PACK | Freq: Two times a day (BID) | ORAL | 0 refills | Status: AC

## 2016-10-13 NOTE — Progress Notes (Signed)
Progress Note  Assessment / Plan:    Assessment: 1. Gastric outlet obstruction: Patient with EGD 10/11/16 revealing esophagitis and a gastric ulcer; etiology G of obstruction is still uncertain again questioned possible perineoplastic process versus other 2. Nausea and vomiting: Resolved, tolerating a full liquid diet 3. Abdominal bloating: This has increased somewhat over the past 2 days, patient has not had a bowel movement since 10/11/16, likely this is the cause 4. Abnormal CT of the chest and abdomen: Then further workup will be needed as an outpatient 5. Leukocytosis: Normalized today  Plan: 1. Will advance patient to a regular diet for lunch 2. Continue MiraLAX twice a day 3. The hospitalist added Senokot twice a day 4. Discussed with the patient and his daughter that we will ensure that he is able to eat and evacuate before leaving. 5. Hospitalist did discuss with family this morning the recommendations for follow-up of lung abnormality on CT 6. Patient will need to be continued on Protonix 40 mg twice a day at time of discharge and will need follow-up with our clinic, Dr. Henrene Pastor or myself in the next 2-3 weeks 7. Encouraged the patient to drink plenty of water and get up and walk around 8. Please await any further recommendations from Dr. Carlean Purl   LOS: 2 days   Lavone Nian [facility]  10/13/2016, 9:36 AM  Pager # [phone]    [facility] Attending   I have taken an interval history, reviewed the chart and examined the patient. I agree with the Advanced Practitioner's note, impression and recommendations.     He is better - will f/u path and call as outpatient Do not think he has to defecate to go home as I bet enemas cleared quite a bit of stool. Do not plan to see him tomorrow - call if ?  Gatha Mayer, MD, [facility] Gastroenterology [phone] (pager) [phone] after 5 PM, weekends and holidays  10/13/2016 3:30 PM    Subjective  Chief Complaint:  Gastric Outlet Obstruction  This morning, the patient is found laying in his bed with his daughter by his bedside. He tells me that he has not had a bowel movement since the night of 10/11/16 after 2 SMOG enemas. Overall for the past 2 days, he has had an increasing bloating feeling and generalized abdominal distention. He did tolerate a full liquid diet for dinner last night and this morning for breakfast. He has had 3 doses of MiraLAX since yesterday. He does tell me that he hears a lot of movement in his belly and is passing gas. He denies any nausea or vomiting.  In discussion with his daughter, she does not want him sent home "too early". They do not want have come back to the ER. She would prefer that he stay in the hospital for another night and make sure things are moving through and he is tolerating a regular diet before leaving.   Objective   Vital signs in last 24 hours: Temp:  [98.5 F (36.9 C)-98.6 F (37 C)] 98.6 F (37 C) (01/05 0600) Pulse Rate:  [75-78] 78 (01/05 0600) Resp:  [18-20] 20 (01/05 0600) BP: (112-167)/(58-79) 167/79 (01/05 0600) SpO2:  [94 %-96 %] 94 % (01/05 0600) Last BM Date: 10/11/16 General:Caucasian male in NAD Heart:  Regular rate and rhythm; no murmurs Lungs: Respirations even and unlabored, lungs CTA bilaterally Abdomen:  Soft, nontender and mild distention. Normal bowel sounds all 4 quadrants. Extremities:  Without edema. Neurologic:  Alert  and oriented,  grossly normal neurologically. Psych:  Cooperative. Normal mood and affect.   Lab Results:  Recent Labs  10/11/16 1926 10/12/16 0359 10/13/16 0411  WBC 14.3* 11.3* 9.5  HGB 10.9* 10.2* 10.9*  HCT 33.4* 31.5* 33.6*  PLT 348 299 320   BMET  Recent Labs  10/10/16 1733 10/12/16 0359 10/13/16 0411  NA 135 140 136  K 3.7 3.8 3.8  CL 95* 105 102  CO2 27 27 26   GLUCOSE 128* 92 98  BUN 19 22* 16  CREATININE 1.27* 1.31* 1.22  CALCIUM 10.3 8.8* 8.8*

## 2016-10-13 NOTE — Progress Notes (Signed)
PROGRESS NOTE  Nicholas Diaz [id number] DOB: 06-11-1924 DOA: 10/10/2016 PCP: Jerlyn Ly, MD  HPI/Recap of past 24 hours:  Sitting on the edge of bed, denies pain, report feeling better, no bm  Daughter in room  Assessment/Plan: Principal Problem:   Gastric outlet obstruction Active Problems:   CKD (chronic kidney disease), stage III   Leukocytosis   Left leg swelling   Acute pylorus ulcer   Persistent vomiting   AKI (acute kidney injury) ([place])   Gastric outlet obstruction/Acute pylorus ulcer:: on ppi, Seen by GI and underwent endoscopy. No obstruction noted only esophagitis and gastric ulcer. Diet advancement per GI. Outpatient gi follow up for possible outpatient colonoscopy. .Acute kidney injury in the setting of CKD (chronic kidney disease), stage II: ua no infection, ct ab/pel "Mild to moderate bilateral hydroureteronephrosis and chronically delayed LEFT renal function. Eccentrically thickened urinary bladder, recommend cystoscopy." Patient has no problems urinating. ua no blood, cr normalized at discharge.   psa 1.02, Need to follow up with urology for cystoscopy. .Leukocytosis: Unclear etiology. Stress margination versus dehydration , so far no sign of infection. Wbc normalized at discharge. .Left leg swelling: report chronic ,Negative lower extremity Doppler .9 mm spiculated right lower lobe lung nodule: Outpatient PET scan versus biopsy with CT at 3 months. Enlarged aortic aneurysm: At 4.2 cm. Repeat CT in 1 year. Constipation: stool regiemen    Code Status: full  Family Communication: patient , daughter at bedside, son over the phone  Disposition Plan: home 1/6 with gi clearnace   Consultants:  GI  Procedures:  EGD  Antibiotics:  none   Objective: BP (!) 153/76 (BP Location: Right Arm)   Pulse 76   Temp 98 F (36.7 C) (Oral)   Resp 16   Ht 5\' 9"  (1.753 m)   Wt 88.5 kg (195 lb)   SpO2 97%   BMI 28.80 kg/m   Intake/Output Summary  (Last 24 hours) at 10/13/16 1630 Last data filed at 10/13/16 0817  Gross per 24 hour  Intake              480 ml  Output                0 ml  Net              480 ml   Filed Weights   10/11/16 0745 10/11/16 1456  Weight: 88.5 kg (195 lb) 88.5 kg (195 lb)    Exam:   General:  NAD  Cardiovascular: RRR  Respiratory: CTABL  Abdomen: Soft/ND/NT, positive BS  Musculoskeletal: left lower extremity pitting edema  Neuro: aaox3  Data Reviewed: Basic Metabolic Panel:  Recent Labs Lab 10/10/16 1733 10/12/16 0359 10/13/16 0411  NA 135 140 136  K 3.7 3.8 3.8  CL 95* 105 102  CO2 27 27 26   GLUCOSE 128* 92 98  BUN 19 22* 16  CREATININE 1.27* 1.31* 1.22  CALCIUM 10.3 8.8* 8.8*  MG  --   --  1.9   Liver Function Tests:  Recent Labs Lab 10/10/16 1733 10/12/16 0359  AST 32 28  ALT 50 34  ALKPHOS 119 84  BILITOT 0.4 0.5  PROT 7.1 5.2*  ALBUMIN 3.9 2.9*    Recent Labs Lab 10/10/16 1733  LIPASE 43   No results for input(s): AMMONIA in the last 168 hours. CBC:  Recent Labs Lab 10/10/16 1733 10/11/16 1926 10/12/16 0359 10/13/16 0411  WBC 16.6* 14.3* 11.3* 9.5  HGB 12.9* 10.9* 10.2* 10.9*  HCT 39.4 33.4* 31.5* 33.6*  MCV 85.8 88.1 89.2 88.2  PLT 416* 348 299 320   Cardiac Enzymes:   No results for input(s): CKTOTAL, CKMB, CKMBINDEX, TROPONINI in the last 168 hours. BNP (last 3 results) No results for input(s): BNP in the last 8760 hours.  ProBNP (last 3 results) No results for input(s): PROBNP in the last 8760 hours.  CBG: No results for input(s): GLUCAP in the last 168 hours.  No results found for this or any previous visit (from the past 240 hour(s)).   Studies: No results found.  Scheduled Meds: . famotidine (PEPCID) IV  20 mg Intravenous Q24H  . feeding supplement (ENSURE ENLIVE)  237 mL Oral TID BM  . mouth rinse  15 mL Mouth Rinse BID  . pantoprazole (PROTONIX) IV  40 mg Intravenous Q12H  . polyethylene glycol  17 g Oral BID  .  senna-docusate  1 tablet Oral BID    Continuous Infusions:   Time spent: 53mins  Sigismund Cross MD, PhD  [facility] Pager [phone]. If 7PM-7AM, please contact night-coverage at [email or web address], password [facility] 10/13/2016, 4:30 PM  LOS: 2 days

## 2016-10-13 NOTE — Progress Notes (Addendum)
Initial Nutrition Assessment  DOCUMENTATION CODES:   Not applicable  INTERVENTION:   Ensure Enlive po TID, each supplement provides 350 kcal and 20 grams of protein  NUTRITION DIAGNOSIS:   Inadequate oral intake related to other (see comment), nausea, poor appetite (Constipation r/t gastric outflow obstruction ) as evidenced by per patient/family report.  GOAL:   Patient will meet greater than or equal to 90% of their needs  MONITOR:   PO intake  REASON FOR ASSESSMENT:   Malnutrition Screening Tool    ASSESSMENT:    83 year old male with past history significant for hyperlipidemia and stage II chronic kidney disease admitted for one week of abdominal distention with vomiting. CT scan noted gastric outlet obstruction and lab work noted acute kidney injury along with white blood cell count of 16. He was also found to have an incidental spiculated lung mass as well as thickening of the bladder, both suspicious for malignancy. EGD revealed gastric ulcer and esophagitis   Met with pt in room today. Pt reports eating 75% of meals and feeling much better today. Pt reports good appetite up until 2 days pta. Pt reports gastric distension, trouble swallowing, nausea pta. Pt denies N/V today. Reports had a small BM today and abdomen feels less distended. Pt had SMOG enema x 2 on 1/3. Pt had EGD 1/3, found to have gastric ulcer and esophagitis. Pt has lost 10lbs(5%) in two weeks. This is significant. Pt likes EE.   Medications reviewed and include: pepcid, protonix, miralax, senokot  Labs reviewed: Ca 8.8(L) adj. 9.68 wnl, Alb 2.9(L)  Nutrition-Focused physical exam completed. Findings are no fat depletion, no muscle depletion, and no edema.   Diet Order:  Diet regular Room service appropriate? Yes; Fluid consistency: Thin  Skin:  Reviewed, no issues  Last BM:  1/3, small BM 1/5  Height:   Ht Readings from Last 1 Encounters:  10/11/16 5' 9"  (1.753 m)    Weight:   Wt Readings  from Last 1 Encounters:  10/11/16 195 lb (88.5 kg)    Ideal Body Weight:  72.7 kg  BMI:  Body mass index is 28.8 kg/m.  Estimated Nutritional Needs:   Kcal:  1800-2200kcal/day   Protein:  89-106g/day   Fluid:  >1.8L/day   EDUCATION NEEDS:   No education needs identified at this time  Koleen Distance, RD, LDN Pager #[phone]

## 2016-10-13 NOTE — Discharge Summary (Signed)
Discharge Summary  Nicholas Diaz [id number] DOB: Jun 01, 1924  PCP: Jerlyn Ly, MD  Admit date: 10/10/2016 Discharge date: 10/14/2016  Time spent: <34mins  Recommendations for Outpatient Follow-up:  1. F/u with PMD within a week  for hospital discharge follow up, repeat cbc/bmp at follow up. Patient need to have PET scan for lung nodules, need to have repeat CT scan in a year for Enlarged aortic aneurysm. 2. F/u with pulmonology for lung nodules  3. F/u with urology  4. F/u with GI  Discharge Diagnoses:  Active Hospital Problems   Diagnosis Date Noted  . Gastric outlet obstruction 10/11/2016  . CKD (chronic kidney disease), stage III 10/11/2016  . Leukocytosis 10/11/2016  . Left leg swelling 10/11/2016  . AKI (acute kidney injury) ([place]) 10/11/2016  . Acute pylorus ulcer   . Persistent vomiting     Resolved Hospital Problems   Diagnosis Date Noted Date Resolved  No resolved problems to display.    Discharge Condition: stable  Diet recommendation: regular diet  Filed Weights   10/11/16 0745 10/11/16 1456  Weight: 88.5 kg (195 lb) 88.5 kg (195 lb)    History of present illness:  Patient coming from: Home --> PCP's office  Chief Complaint: Vomiting and bloating  HPI: Nicholas Diaz is a 83 y.o. male with a past medical history significant for hyperlipidemia who presents with 1 month bloating and abdominal pain.  The patient was in his usual state of health until about one month ago when he had sudden onset of bloating and constipation. He saw his PCP, who referred him to GI, who he saw on 12/20.  At that time, his lab work was unremarkable, and a CT of the abdomen and pelvis showed nonspecific pancreatic stranding and hydronephrosis and so he was referred to Urology in February and told to take MiraLAX and follow up with PCP.  Now in the last week, he has had worsening of his bloating, vomiting, and abdominal distension.  He has minimal pain, but notes a 15 lbs  weight loss.  No hematemesis, dyspepsia, melena.  No previous pancreatitis, ulcers.  Had colonoscopy once 10 years ago, saw some polyps, no follow up since.  Does not use NSAIDs or blood thinners. Smoked 60 years, quit in the 34s.  No alcohol history.  Today because his vomiting and bloating were getting worse and went to see his PCP, who drew labs and called him later to say that he had a leukocytosis and elevated LFTs and recommended he come to the hospital for antibiotics.  ED course: -afebrile, heart rate 105, respirations and pulse ox normal, blood pressure 135/89 -Na 135, K 3.7, Cr 1.27 (baseline 1.3), WBC 16.6K, Hgb 12.9, LFTs normal -Lipase normal -INR normal -Lactic acid 1.2 -CT of the abdomen and pelvis with contrast showed gastric outlet obstruction, no bladder disease, no clear pancreatitis; they're also incidental findings of thoracic artery aneurysm, and a 9 mm spiculated lung mass.    Hospital Course:  Principal Problem:   Gastric outlet obstruction Active Problems:   CKD (chronic kidney disease), stage III   Leukocytosis   Left leg swelling   Acute pylorus ulcer   Persistent vomiting   AKI (acute kidney injury) ([place])   Gastric outlet obstruction/Acute pylorus ulcer:: on ppi, Seen by GI and underwent endoscopy. No obstruction noted only esophagitis and gastric ulcer. Diet advancement per GI. Outpatient gi follow up for possible outpatient colonoscopy. . Acute kidney injury in the setting of CKD (chronic kidney disease), stage  II: ua no infection, ct ab/pel "Mild to moderate bilateral hydroureteronephrosis and chronically delayed LEFT renal function. Eccentrically thickened urinary bladder, recommend cystoscopy." Patient has no problems urinating. ua no blood, cr normalized at discharge.   psa 1.02, Need to follow up with urology for cystoscopy. . Leukocytosis: Unclear etiology. Stress margination versus dehydration , so far no sign of  infection. Wbc normalized at  discharge. . Left leg swelling: report chronic ,Negative lower extremity Doppler .9 mm spiculated right lower lobe lung nodule: Outpatient PET scan versus biopsy with CT at 3 months. Enlarged aortic aneurysm: At 4.2 cm. Repeat CT in 1 year.    Code Status: full  Family Communication: patient ,son  at bedside  Disposition Plan: home 1/6 with [facility]   Consultants:  GI  Procedures:  EGD  Antibiotics:  none    Discharge Exam: BP (!) 149/70 (BP Location: Right Arm)   Pulse 89   Temp 99.1 F (37.3 C) (Oral)   Resp 16   Ht 5\' 9"  (1.753 m)   Wt 88.5 kg (195 lb)   SpO2 97%   BMI 28.80 kg/m    General:  NAD, ambulating in hall with steady gait.  Cardiovascular: RRR  Respiratory: CTABL  Abdomen: Soft/ND/NT, positive BS  Musculoskeletal: left lower extremity pitting edema (chronic)  Neuro: aaox3  Discharge Instructions You were cared for by a hospitalist during your hospital stay. If you have any questions about your discharge medications or the care you received while you were in the hospital after you are discharged, you can call the unit and asked to speak with the hospitalist on call if the hospitalist that took care of you is not available. Once you are discharged, your primary care physician will handle any further medical issues. Please note that NO REFILLS for any discharge medications will be authorized once you are discharged, as it is imperative that you return to your primary care physician (or establish a relationship with a primary care physician if you do not have one) for your aftercare needs so that they can reassess your need for medications and monitor your lab values.  Discharge Instructions    Diet general    Complete by:  As directed      Allergies as of 10/14/2016      Reactions   Ivp Dye [iodinated Diagnostic Agents]    Dye that was used for macular degeneration       Medication List    TAKE these medications   aspirin EC  81 MG tablet Take 81 mg by mouth daily.   ezetimibe-simvastatin 10-10 MG tablet Commonly known as:  VYTORIN Take 1 tablet by mouth at bedtime.   feeding supplement (ENSURE ENLIVE) Liqd Take 237 mLs by mouth 3 (three) times daily between meals.   FISH OIL PO Take 1 capsule by mouth daily.   MULTI-VITAMINS Tabs Take 1 tablet by mouth daily.   omeprazole 40 MG capsule Commonly known as:  PRILOSEC Take 1 capsule (40 mg total) by mouth 2 (two) times daily. What changed:  when to take this   polyethylene glycol packet Commonly known as:  MIRALAX / GLYCOLAX Take 17 g by mouth 2 (two) times daily. Hold if diarrhea   polyvinyl alcohol 1.4 % ophthalmic solution Commonly known as:  LIQUIFILM TEARS Place 1 drop into both eyes daily as needed for dry eyes   VITAMIN D-1000 MAX ST 1000 units tablet Generic drug:  Cholecalciferol Take 1,000 Units by mouth daily.  Allergies  Allergen Reactions  . Ivp Dye [Iodinated Diagnostic Agents]     Dye that was used for macular degeneration    Follow-up Information    PERINI,MARK A, MD Follow up in 1 week(s).   Specialty:  Internal Medicine Why:  hospital discharge follow up. pmd to refer patient to urology and pulmonology outpateint PET scan for lung nodules. Enlarged aortic aneurysm: At 4.2 cm. Repeat CT in 1 year. Contact information: [address] [place] [address] [phone]        Scarlette Shorts, MD Follow up in 1 week(s).   Specialty:  Gastroenterology Contact information: [address] [place] [address] [phone]        follow up with urology for Follow up in 2 week(s).   Why:  Ct findings: "Mild to moderate bilateral hydroureteronephrosis and chronically delayed LEFT renal function. Eccentrically thickened urinary bladder, recommend cystoscopy."       follow up with lung nodules Follow up.   Why:  CT chest: "3 mm ground-glass pulmonary nodule RIGHT middle lobe. Spiculated solid 9 mm RIGHT lower  lobe pulmonary nodule "           The results of significant diagnostics from this hospitalization (including imaging, microbiology, ancillary and laboratory) are listed below for reference.    Significant Diagnostic Studies: Ct Chest W Contrast  Result Date: 10/11/2016 CLINICAL DATA:  Abdominal pain and distension, nausea and vomiting beginning on October 02, 2016. Leukocytosis. History of RIGHT inguinal hernia repair, hyperlipidemia. EXAM: CT CHEST, ABDOMEN, AND PELVIS WITH CONTRAST TECHNIQUE: Multidetector CT imaging of the chest, abdomen and pelvis was performed following the standard protocol during bolus administration of intravenous contrast. CONTRAST:  100 cc Isovue 300 COMPARISON:  CT abdomen and pelvis September 28, 2016. FINDINGS: CT CHEST FINDINGS CARDIOVASCULAR: Aneurysmal ascending aorta at 4.2 cm with moderate calcific atherosclerosis. Heart size is normal. Mild coronary artery calcifications, annular calcifications. No pericardial effusion. MEDIASTINUM/NODES: No mediastinal mass. No lymphadenopathy by CT size criteria. Normal appearance of thoracic esophagus though not tailored for evaluation. LUNGS/PLEURA: 3 mm ground-glass pulmonary nodule RIGHT middle lobe. Spiculated solid 9 mm RIGHT lower lobe pulmonary nodule (series 4, image 58/125). Small RIGHT pleural effusion. Tracheobronchial tree is patent, mild bronchial wall thickening. MUSCULOSKELETAL: Included soft tissues and included osseous structures appear normal. CT ABDOMEN AND PELVIS FINDINGS HEPATOBILIARY: Calcified granuloma of liver, minimal biliary dilatation. Contracted gallbladder. PANCREAS: Normal. SPLEEN: Normal. ADRENALS/URINARY TRACT: Kidneys are orthotopic, persistently delayed LEFT nephrogram. No nephrolithiasis, hydronephrosis or solid renal masses. Mild to moderate bilateral hydroureteronephrosis. Normal contrast excretion RIGHT kidney on delayed, non on the LEFT. Urinary bladder is mild eccentric posterior bladder  wall thickening up to 7 mm. No intravesicular calculi. Normal adrenal glands. STOMACH/BOWEL: Markedly fluid distended stomach. Moderate colonic diverticulosis. Moderate amount of retained large bowel stool. The small bowel are normal in course and caliber without inflammatory changes. Persistent fat stranding pancreas and duodenum sweep. Normal appendix. VASCULAR/LYMPHATIC: Chronic infrarenal aortic dissection in ectasia measuring up to 2.7 cm. No lymphadenopathy by CT size criteria. Mesenteric edema. Mildly thickened omentum. REPRODUCTIVE: Prostate is enlarged, 5 cm in transaxial dimension invading the base the urinary bladder. OTHER: Increasing moderate amount of ascites. No intraperitoneal free air. MUSCULOSKELETAL: Non-acute. Small fat containing inguinal hernia. Subcentimeter sclerotic focus RIGHT iliac bone, a additional smaller sclerotic foci in the pelvis. Osteopenia. IMPRESSION: CT CHEST: Mild bronchial wall thickening can be seen with reactive airway disease or bronchitis without pneumonia. **An incidental finding of potential clinical significance has been found.  4.2 cm aneurysmal ascending aorta. Recommend annual imaging followup by CTA or MRA. This recommendation follows 2010 ACCF/AHA/AATS/ACR/ASA/SCA/SCAI/SIR/STS/SVM Guidelines for the Diagnosis and Management of Patients with Thoracic Aortic Disease. Circulation. 2010; [id number]** **An incidental finding of potential clinical significance has been found. 9 mm spiculated solid RIGHT lower lobe pulmonary nodule. Consider PET CT, CT or tissue sampling at 3 months.** CT ABDOMEN AND PELVIS: Markedly distended stomach concerning for gastric outlet obstruction. Persistent fat stranding about the head of the pancreas and duodenum sweep, possible duodenitis. Consider endoscopy. Mild to moderate bilateral hydroureteronephrosis and chronically delayed LEFT renal function. Eccentrically thickened urinary bladder, recommend cystoscopy. Increasing small to  moderate amount of ascites and mesenteric edema. Mildly thickened omentum, which may be reactive though, associated with neoplasm. Electronically Signed   By: Elon Alas M.D.   On: 10/11/2016 02:52   Ct Abdomen Pelvis W Contrast  Result Date: 10/11/2016 CLINICAL DATA:  Abdominal pain and distension, nausea and vomiting beginning on October 02, 2016. Leukocytosis. History of RIGHT inguinal hernia repair, hyperlipidemia. EXAM: CT CHEST, ABDOMEN, AND PELVIS WITH CONTRAST TECHNIQUE: Multidetector CT imaging of the chest, abdomen and pelvis was performed following the standard protocol during bolus administration of intravenous contrast. CONTRAST:  100 cc Isovue 300 COMPARISON:  CT abdomen and pelvis September 28, 2016. FINDINGS: CT CHEST FINDINGS CARDIOVASCULAR: Aneurysmal ascending aorta at 4.2 cm with moderate calcific atherosclerosis. Heart size is normal. Mild coronary artery calcifications, annular calcifications. No pericardial effusion. MEDIASTINUM/NODES: No mediastinal mass. No lymphadenopathy by CT size criteria. Normal appearance of thoracic esophagus though not tailored for evaluation. LUNGS/PLEURA: 3 mm ground-glass pulmonary nodule RIGHT middle lobe. Spiculated solid 9 mm RIGHT lower lobe pulmonary nodule (series 4, image 58/125). Small RIGHT pleural effusion. Tracheobronchial tree is patent, mild bronchial wall thickening. MUSCULOSKELETAL: Included soft tissues and included osseous structures appear normal. CT ABDOMEN AND PELVIS FINDINGS HEPATOBILIARY: Calcified granuloma of liver, minimal biliary dilatation. Contracted gallbladder. PANCREAS: Normal. SPLEEN: Normal. ADRENALS/URINARY TRACT: Kidneys are orthotopic, persistently delayed LEFT nephrogram. No nephrolithiasis, hydronephrosis or solid renal masses. Mild to moderate bilateral hydroureteronephrosis. Normal contrast excretion RIGHT kidney on delayed, non on the LEFT. Urinary bladder is mild eccentric posterior bladder wall thickening up to  7 mm. No intravesicular calculi. Normal adrenal glands. STOMACH/BOWEL: Markedly fluid distended stomach. Moderate colonic diverticulosis. Moderate amount of retained large bowel stool. The small bowel are normal in course and caliber without inflammatory changes. Persistent fat stranding pancreas and duodenum sweep. Normal appendix. VASCULAR/LYMPHATIC: Chronic infrarenal aortic dissection in ectasia measuring up to 2.7 cm. No lymphadenopathy by CT size criteria. Mesenteric edema. Mildly thickened omentum. REPRODUCTIVE: Prostate is enlarged, 5 cm in transaxial dimension invading the base the urinary bladder. OTHER: Increasing moderate amount of ascites. No intraperitoneal free air. MUSCULOSKELETAL: Non-acute. Small fat containing inguinal hernia. Subcentimeter sclerotic focus RIGHT iliac bone, a additional smaller sclerotic foci in the pelvis. Osteopenia. IMPRESSION: CT CHEST: Mild bronchial wall thickening can be seen with reactive airway disease or bronchitis without pneumonia. **An incidental finding of potential clinical significance has been found. 4.2 cm aneurysmal ascending aorta. Recommend annual imaging followup by CTA or MRA. This recommendation follows 2010 ACCF/AHA/AATS/ACR/ASA/SCA/SCAI/SIR/STS/SVM Guidelines for the Diagnosis and Management of Patients with Thoracic Aortic Disease. Circulation. 2010; [id number]** **An incidental finding of potential clinical significance has been found. 9 mm spiculated solid RIGHT lower lobe pulmonary nodule. Consider PET CT, CT or tissue sampling at 3 months.** CT ABDOMEN AND PELVIS: Markedly distended stomach concerning for gastric outlet obstruction. Persistent fat stranding about the  head of the pancreas and duodenum sweep, possible duodenitis. Consider endoscopy. Mild to moderate bilateral hydroureteronephrosis and chronically delayed LEFT renal function. Eccentrically thickened urinary bladder, recommend cystoscopy. Increasing small to moderate amount of  ascites and mesenteric edema. Mildly thickened omentum, which may be reactive though, associated with neoplasm. Electronically Signed   By: Elon Alas M.D.   On: 10/11/2016 02:52   Ct Abdomen Pelvis W Contrast  Result Date: 09/28/2016 CLINICAL DATA:  New onset of constipation, abdominal pain and disc on fourth, premedication for contrast allergy EXAM: CT ABDOMEN AND PELVIS WITH CONTRAST TECHNIQUE: Multidetector CT imaging of the abdomen and pelvis was performed using the standard protocol following bolus administration of intravenous contrast. CONTRAST:  21mL ISOVUE-300 IOPAMIDOL (ISOVUE-300) INJECTION 61% COMPARISON:  None. FINDINGS: Lower chest: Lung bases are normal. Small right pleural effusion with right base posterior atelectasis. Hepatobiliary: There is mild fatty infiltration of the liver. Contracted gallbladder without evidence of calcified gallstones. Small perihepatic ascites is noted. No focal hepatic mass. Pancreas: No focal pancreatic mass. There is subtle mild stranding of peripancreatic fat in the region of pancreatic head adjacent to distal duodenum. Subtle mild pancreatitis cannot be excluded. Subtle mild stranding of retroperitoneal and mesenteric fat just inferior to pancreas/ anterior to distal duodenum. Again mild inflammatory process duodenitis or pancreatitis cannot be excluded. Spleen: Enhanced spleen is unremarkable.  Trace perisplenic ascites. Adrenals/Urinary Tract: No adrenal gland mass. There is mild decreased enhancement of the left kidney. Mild left perinephric stranding. Mild left hydronephrosis. Delayed renal images shows mild delay excretion of the left kidney. Although findings may be due to obstructive uropathy/ureteral stricture inflammatory process or ureteral neoplastic process cannot be excluded. Correlation with urology exam is recommended. No nephrolithiasis. No calcified ureteral calculi are noted. Distal left ureter is small caliber. Stomach/Bowel: No small  bowel obstruction. There is a distended stomach with moderate debris. Mild gastric ileus or gastroparesis cannot be excluded. No gastric outlet obstruction. Contrast material noted within distal small bowel loops. No distal colonic obstruction. Terminal ileum is unremarkable. Normal appendix is partially visualized. There is small ascites in left paracolic gutter. Nonspecific mild anterior mesenteric edema just inferior to transverse colon. Trace pelvic ascites is noted. Moderate stool noted within cecum. No pericecal inflammation. Moderate stool noted in right colon and transverse colon. Some colonic stool noted in descending colon. Vascular/Lymphatic: There is focal ectatic abdominal aorta axial image 39 measures 2.7 cm in diameter. This is best seen in coronal image 67. Probable calcified Previous dissection flap at this level is noted axial image 39. This is probable sequela from prior aortic wall dissection. Atherosclerotic calcifications of abdominal aorta and iliac arteries are noted. Small nonspecific mesenteric lymph nodes are noted without mesenteric adenopathy. Reproductive: Prostate gland and seminal vesicles are unremarkable. Other: There is under distended urinary bladder. Mild nonspecific mild thickening of urinary bladder wall. Mild cystitis or inflammation cannot be excluded. No evidence of free abdominal air. Small lateral pelvic ascites. There is a small left inguinal scrotal canal hernia containing fat without evidence of acute complication measures 1.2 cm. Musculoskeletal: No destructive bony lesions are noted. Sagittal images of the spine shows osteopenia and degenerative changes thoracolumbar spine. IMPRESSION: 1. There is small perihepatic and trace perisplenic ascites. Small ascites noted in left paracolic gutters. Small pelvic ascites is noted. 2. Subtle mild stranding of the fat in the region of pancreatic head and medial to duodenum. Subtle mild pancreatitis or duodenitis cannot be  excluded. Clinical correlation is necessary. 3. There is mild  decreased enhancement and delay excretion of the left kidney. Early obstructive uropathy cannot be excluded. Mild left hydronephrosis and proximal left hydroureter. Mild left perinephric stranding. Ureteral stricture, inflammatory process or neoplastic process cannot be excluded. Correlation with urology exam is recommended. Nonspecific mild thickening of urinary bladder wall. No nephrolithiasis. 4. Moderate gastric distension with some contrast and debris. Gastroparesis or gastric ileus cannot be excluded. No gastric outlet obstruction. 5. No pericecal inflammation. Moderate stool noted within cecum. Normal appendix. 6. Osteopenia and degenerative changes lumbar spine. 7. There is focal ectatic abdominal aorta axial image 39 measures 2.7 cm in diameter. This is best seen in coronal image 67. Probable calcified Previous dissection flap at this level is noted axial image 39. This is probable sequela from prior aortic wall dissection. Electronically Signed   By: Lahoma Crocker M.D.   On: 09/28/2016 12:58    Microbiology: No results found for this or any previous visit (from the past 240 hour(s)).   Labs: Basic Metabolic Panel:  Recent Labs Lab 10/10/16 1733 10/12/16 0359 10/13/16 0411  NA 135 140 136  K 3.7 3.8 3.8  CL 95* 105 102  CO2 27 27 26   GLUCOSE 128* 92 98  BUN 19 22* 16  CREATININE 1.27* 1.31* 1.22  CALCIUM 10.3 8.8* 8.8*  MG  --   --  1.9   Liver Function Tests:  Recent Labs Lab 10/10/16 1733 10/12/16 0359  AST 32 28  ALT 50 34  ALKPHOS 119 84  BILITOT 0.4 0.5  PROT 7.1 5.2*  ALBUMIN 3.9 2.9*    Recent Labs Lab 10/10/16 1733  LIPASE 43   No results for input(s): AMMONIA in the last 168 hours. CBC:  Recent Labs Lab 10/10/16 1733 10/11/16 1926 10/12/16 0359 10/13/16 0411  WBC 16.6* 14.3* 11.3* 9.5  HGB 12.9* 10.9* 10.2* 10.9*  HCT 39.4 33.4* 31.5* 33.6*  MCV 85.8 88.1 89.2 88.2  PLT 416* 348 299  320   Cardiac Enzymes: No results for input(s): CKTOTAL, CKMB, CKMBINDEX, TROPONINI in the last 168 hours. BNP: BNP (last 3 results) No results for input(s): BNP in the last 8760 hours.  ProBNP (last 3 results) No results for input(s): PROBNP in the last 8760 hours.  CBG: No results for input(s): GLUCAP in the last 168 hours.     SignedFlorencia Reasons MD, PhD  [facility] 10/14/2016, 8:14 AM

## 2016-10-14 MED ORDER — ENSURE ENLIVE PO LIQD
237.0000 mL | Freq: Three times a day (TID) | ORAL | 12 refills | Status: AC
Start: 2016-10-14 — End: ?

## 2016-10-14 MED ORDER — OMEPRAZOLE 40 MG PO CPDR
40.0000 mg | DELAYED_RELEASE_CAPSULE | Freq: Two times a day (BID) | ORAL | 0 refills | Status: AC
Start: 2016-10-14 — End: 2016-12-06

## 2016-10-14 NOTE — Progress Notes (Signed)
Patient discharged to home, all discharge medications and instructions reviewed and questions answered. Patient prefers to ambulate and does not want wheelchair assist.

## 2016-10-16 ENCOUNTER — Telehealth: Payer: Self-pay | Admitting: Internal Medicine

## 2016-10-16 NOTE — Telephone Encounter (Signed)
pt wanting to switch care to Dr.Gessner due to seeing him in the hosp recently. Patient has nothing against Dr.Perry but son states since he only saw him for procedure almost 10 years ago and Dr.Gessner just saw him, he would feel more comfortable with Carlean Purl. Pt son states that Dr.Gessner has already given verbal agreement. Pt son would like a call back to resch appt with Dr.Perry on 10/20/16 if the switch is approved

## 2016-10-16 NOTE — Telephone Encounter (Signed)
Dr. Carlean Purl are you ok seeing this pt? Please see below and advise.

## 2016-10-16 NOTE — Telephone Encounter (Signed)
Very reasonable. Sure

## 2016-10-16 NOTE — Telephone Encounter (Signed)
Dr. Henrene Pastor please advise regarding pt switching care to Dr. Carlean Purl. See note below.

## 2016-10-17 ENCOUNTER — Ambulatory Visit (INDEPENDENT_AMBULATORY_CARE_PROVIDER_SITE_OTHER): Payer: Medicare Other | Admitting: Internal Medicine

## 2016-10-17 ENCOUNTER — Ambulatory Visit (INDEPENDENT_AMBULATORY_CARE_PROVIDER_SITE_OTHER)
Admission: RE | Admit: 2016-10-17 | Discharge: 2016-10-17 | Disposition: A | Discharge status: Home / Self Care | Payer: Medicare Other | Source: Ambulatory Visit | Attending: Internal Medicine | Admitting: Internal Medicine

## 2016-10-17 ENCOUNTER — Telehealth: Payer: Self-pay | Admitting: Internal Medicine

## 2016-10-17 ENCOUNTER — Encounter: Payer: Self-pay | Admitting: Internal Medicine

## 2016-10-17 VITALS — BP 144/84 | HR 96 | Ht 69.0 in | Wt 195.5 lb

## 2016-10-17 DIAGNOSIS — K253 Acute gastric ulcer without hemorrhage or perforation: Secondary | ICD-10-CM | POA: Diagnosis not present

## 2016-10-17 DIAGNOSIS — R9349 Abnormal radiologic findings on diagnostic imaging of other urinary organs: Secondary | ICD-10-CM

## 2016-10-17 DIAGNOSIS — R1115 Cyclical vomiting syndrome unrelated to migraine: Secondary | ICD-10-CM

## 2016-10-17 DIAGNOSIS — N1330 Unspecified hydronephrosis: Secondary | ICD-10-CM | POA: Diagnosis not present

## 2016-10-17 DIAGNOSIS — R911 Solitary pulmonary nodule: Secondary | ICD-10-CM

## 2016-10-17 DIAGNOSIS — R1110 Vomiting, unspecified: Secondary | ICD-10-CM

## 2016-10-17 DIAGNOSIS — K5900 Constipation, unspecified: Secondary | ICD-10-CM

## 2016-10-17 MED ORDER — METOCLOPRAMIDE HCL 5 MG PO TABS: 5.0000 mg | ORAL_TABLET | Freq: Three times a day (TID) | ORAL | 0 refills | Status: DC

## 2016-10-17 NOTE — Telephone Encounter (Signed)
Pts son aware of bx results. States pt is not able to keep liquids down, states he is vomiting quite a bit. Pt scheduled to see Dr. Carlean Purl today at 3:45pm. Pts son aware of appt.

## 2016-10-17 NOTE — Telephone Encounter (Signed)
I will see him  Let thm know ulcer bxs benign  Will need to cancel 1/12 visit with Dr. Henrene Pastor  I am willing to see him 1/11 if they can make that or early next week when in office

## 2016-10-17 NOTE — Telephone Encounter (Signed)
See additional phone notes from today 

## 2016-10-17 NOTE — Patient Instructions (Addendum)
  Please keep your Dr Joylene Draft appointment for tomorrow.   Stay on a liquid diet.  Before leaving today go to the basement and get an abdominal x-ray.   You may use dulcolax as needed to get your bowels to move.   We have sent the following medications to your pharmacy for you to pick up at your convenience: Reglan   We have set you up for a PET scan to be done 10/30/16 at 7:00AM, arrive at [facility] at 6:30AM, nothing to eat or drink after midnight. If you have any questions or need to change the date call [phone].   You have an appointment with [facility] to see Dr Louis Meckel on 11/20/16. They have put you on a cancellation list. There phone number is [phone].   I appreciate the opportunity to care for you. Silvano Rusk, MD, [facility]

## 2016-10-17 NOTE — Progress Notes (Signed)
Ulcer benign Info to be given via phone call already in process - see that note

## 2016-10-17 NOTE — Progress Notes (Signed)
Nicholas Diaz 83 y.o. October 04, 1924 [id number]  Assessment & Plan:   Encounter Diagnoses  Name Primary?  . Persistent vomiting Yes  . Acute pylorus ulcer   . Hydronephrosis, bilateral   . Constipation, unspecified constipation type   . Lung nodule   . Abnormal urinary tract, radiological    Cause of his problems are not clear to me. Gastric ulcer benign and small and no physical GOO. Acts like a gastroparesis problem ? If he has lung cancer and a paraneoplastic syndrome ? Other malignancy - thickened omentum on CT and ascites ? If hydronephrosis is causing some of these sxs  Will go ahead and schedule a PET scan to work up lung lesion Try Reglan 5 mg qac Dulcolax prn + MiraLAx for constipation   I appreciate the opportunity to care for this patient. WU:JWJXBJ,YNWG A, MD   Subjective:   Chief Complaint: vomiting again  HPI 83 yo wm I met when hospitalized with dilated fluid filled stomach and a benign pyloric chanel ulcer - had been seen here 12/22 with inflammatory changes around duodenum I found a small pyloric ulcer - bxs benign. Got enemas at hospital and felt better, was toleratung a diet. Now home and a few episodes of vomiting, post-prandial. Only taking omeprazole now - stopped all other medications  Has difficulty producng a stool Has difficulty urinating also [facility] appt in Feb for unilateral hydronephrosis that now is bilateral with thickened bladder on CT  Mostly liquid diet Has vomited a few times   Seeing Dr. Joylene Draft in AM Allergies  Allergen Reactions  . Ivp Dye [Iodinated Diagnostic Agents]     Dye that was used for macular degeneration    Outpatient Medications Prior to Visit  Medication Sig Dispense Refill  . omeprazole (PRILOSEC) 40 MG capsule Take 1 capsule (40 mg total) by mouth 2 (two) times daily. 60 capsule 0  . aspirin EC 81 MG tablet Take 81 mg by mouth daily.     . Cholecalciferol (VITAMIN D-1000 MAX ST) 1000 units tablet Take 1,000  Units by mouth daily.     Marland Kitchen ezetimibe-simvastatin (VYTORIN) 10-10 MG per tablet Take 1 tablet by mouth at bedtime.    . feeding supplement, ENSURE ENLIVE, (ENSURE ENLIVE) LIQD Take 237 mLs by mouth 3 (three) times daily between meals. (Patient not taking: Reported on 10/17/2016) 237 mL 12  . Multiple Vitamin (MULTI-VITAMINS) TABS Take 1 tablet by mouth daily.     . Omega-3 Fatty Acids (FISH OIL PO) Take 1 capsule by mouth daily.     . polyethylene glycol (MIRALAX / GLYCOLAX) packet Take 17 g by mouth 2 (two) times daily. Hold if diarrhea (Patient not taking: Reported on 10/17/2016) 14 each 0  . polyvinyl alcohol (LIQUIFILM TEARS) 1.4 % ophthalmic solution Place 1 drop into both eyes daily as needed for dry eyes     No facility-administered medications prior to visit.    Past Medical History:  Diagnosis Date  . Hyperlipidemia    Past Surgical History:  Procedure Laterality Date  . ESOPHAGOGASTRODUODENOSCOPY N/A 10/11/2016   Procedure: ESOPHAGOGASTRODUODENOSCOPY (EGD);  Surgeon: Gatha Mayer, MD;  Location: [facility];  Service: Endoscopy;  Laterality: N/A;  . HERNIA REPAIR Right    Social History   Social History  . Marital status: Widowed    Spouse name: N/A  . Number of children: 3  . Years of education: N/A   Occupational History  . retired    Social History Main Topics  .  Smoking status: Former Smoker    Types: Cigarettes, Pipe    Quit date: 10/09/1978  . Smokeless tobacco: Never Used  . Alcohol use No  . Drug use: No  . Sexual activity: Not Asked   Other Topics Concern  . None   Social History Narrative  . None   Family History  Problem Relation Age of Onset  . Colon cancer Paternal Grandmother        Review of Systems As above  Objective:   Physical Exam _0  (!) 144/84   Pulse 96   Ht _1  (1.753 m)   Wt 195 lb 8 oz (88.7 kg)   BMI 28.87 kg/m @  General:  NAD Eyes:   anicteric Lungs:  clear Heart::  S1S2 no rubs, murmurs or gallops Abdomen:  soft  and nontender, BS+ w/ some increased tympany at LUQ, no succussion splash Ext:   trace edema, cyanosis or clubbing    Data Reviewed:   CT scan, ulcer pathology

## 2016-10-18 ENCOUNTER — Other Ambulatory Visit: Payer: Self-pay

## 2016-10-18 DIAGNOSIS — R188 Other ascites: Secondary | ICD-10-CM

## 2016-10-18 NOTE — Progress Notes (Signed)
Stomach was dilated again Is reglan helping?

## 2016-10-18 NOTE — Progress Notes (Signed)
Let him/sion know that I reviewed his scans again - there is increasing fluid in abdomen (ascites) from first CT to second This may be causing some of his problems and I would like him to have an US - guided paracentesis - diagnostic + therapeutic I.e. Drain as much fluid as may be possible  Studies - cell count + diff, albumin, cytology, culture, amylase

## 2016-10-19 ENCOUNTER — Telehealth: Payer: Self-pay | Admitting: Internal Medicine

## 2016-10-19 ENCOUNTER — Ambulatory Visit: Payer: Medicare Other | Admitting: Internal Medicine

## 2016-10-19 NOTE — Telephone Encounter (Signed)
I spoke with the patient's son and explained the reason for paracentesis and all questions answered

## 2016-10-20 ENCOUNTER — Ambulatory Visit: Payer: Medicare Other | Admitting: Internal Medicine

## 2016-10-24 ENCOUNTER — Telehealth: Payer: Self-pay | Admitting: Internal Medicine

## 2016-10-24 NOTE — Telephone Encounter (Signed)
All questions answered about paracentesis.  He will call back for any additional questions or concerns,

## 2016-10-24 NOTE — Telephone Encounter (Signed)
Left message for patient to call back  

## 2016-10-25 ENCOUNTER — Ambulatory Visit (HOSPITAL_COMMUNITY)
Admission: RE | Admit: 2016-10-25 | Discharge: 2016-10-25 | Disposition: A | Discharge status: Home / Self Care | Payer: Medicare Other | Source: Ambulatory Visit | Attending: Internal Medicine | Admitting: Internal Medicine

## 2016-10-25 ENCOUNTER — Other Ambulatory Visit: Payer: Self-pay | Admitting: Internal Medicine

## 2016-10-25 DIAGNOSIS — K253 Acute gastric ulcer without hemorrhage or perforation: Secondary | ICD-10-CM

## 2016-10-25 DIAGNOSIS — R1115 Cyclical vomiting syndrome unrelated to migraine: Secondary | ICD-10-CM

## 2016-10-25 DIAGNOSIS — R188 Other ascites: Secondary | ICD-10-CM

## 2016-10-25 DIAGNOSIS — N1330 Unspecified hydronephrosis: Secondary | ICD-10-CM

## 2016-10-25 DIAGNOSIS — C786 Secondary malignant neoplasm of retroperitoneum and peritoneum: Secondary | ICD-10-CM | POA: Insufficient documentation

## 2016-10-25 DIAGNOSIS — R911 Solitary pulmonary nodule: Secondary | ICD-10-CM

## 2016-10-25 DIAGNOSIS — R9349 Abnormal radiologic findings on diagnostic imaging of other urinary organs: Secondary | ICD-10-CM

## 2016-10-25 LAB — GLUCOSE, CAPILLARY: GLUCOSE-CAPILLARY: 122 mg/dL — AB (ref 65–99)

## 2016-10-25 MED ORDER — FLUDEOXYGLUCOSE F - 18 (FDG) INJECTION
9.7000 | Freq: Once | INTRAVENOUS | Status: AC | PRN
  Administered 2016-10-25: 9.7 via INTRAVENOUS

## 2016-10-25 NOTE — Progress Notes (Signed)
Limited US of the abdomen for possible paracentesis. Small amount of ascites high up over dome of the liver. Not amenable for safe paracentesis. No other localized ascites noted. See full report.  Ascencion Dike PA-C Interventional Radiology 10/25/2016 9:47 AM

## 2016-10-30 ENCOUNTER — Telehealth: Payer: Self-pay | Admitting: Internal Medicine

## 2016-10-30 ENCOUNTER — Encounter (HOSPITAL_COMMUNITY): Payer: Medicare Other

## 2016-10-30 DIAGNOSIS — R933 Abnormal findings on diagnostic imaging of other parts of digestive tract: Secondary | ICD-10-CM

## 2016-10-30 NOTE — Progress Notes (Signed)
I spoke to Dr. Laurence Ferrari and he said he thinks they can bx the omentum Please order and schedule this and indicate that Dr. Laurence Ferrari has reviewed the images and recommends omental nodule bx -  Please call patient's son Timmothy Sours on home to do any coordination on appt - he is aware of situation and tentative plans - note patient has a [facility] appt Friday 1/26  For dx I guess abnormal GI tract on CT might work

## 2016-10-30 NOTE — Telephone Encounter (Signed)
See PET can results for additional details

## 2016-10-30 NOTE — Progress Notes (Signed)
Spoke to patient and son Timmothy Sours about results. I have a call into IR to see if they think they can do a bx on one of the abnormal areas (not lung) Also rec trying 10 mg metaclopramide instead of 5 mg although better he still vomits some - none since fri

## 2016-11-03 DIAGNOSIS — N182 Chronic kidney disease, stage 2 (mild): Secondary | ICD-10-CM | POA: Diagnosis not present

## 2016-11-03 DIAGNOSIS — R1110 Vomiting, unspecified: Secondary | ICD-10-CM | POA: Diagnosis not present

## 2016-11-03 DIAGNOSIS — R911 Solitary pulmonary nodule: Secondary | ICD-10-CM | POA: Diagnosis not present

## 2016-11-06 ENCOUNTER — Telehealth: Payer: Self-pay | Admitting: Internal Medicine

## 2016-11-06 MED ORDER — FUROSEMIDE 20 MG PO TABS: 20.0000 mg | ORAL_TABLET | Freq: Every day | ORAL | 0 refills | Status: AC | PRN

## 2016-11-06 NOTE — Telephone Encounter (Signed)
Spoke to son Furosemide 20 mg qd prn Rx  Await bxs wed

## 2016-11-06 NOTE — Telephone Encounter (Signed)
Son reports that he is still having intermittent vomiting? His son reports lots of dependent edema.  He is having edema in lower extremities and his groin area.  He is scheduled for biopsy on Wed.  They are asking if he can take a diuretic?

## 2016-11-07 ENCOUNTER — Other Ambulatory Visit: Payer: Self-pay | Admitting: General Surgery

## 2016-11-07 ENCOUNTER — Telehealth: Payer: Self-pay | Admitting: Internal Medicine

## 2016-11-07 DIAGNOSIS — H353211 Exudative age-related macular degeneration, right eye, with active choroidal neovascularization: Secondary | ICD-10-CM | POA: Diagnosis not present

## 2016-11-07 NOTE — Telephone Encounter (Signed)
Patient's son is faxing a [employer]ar[employer] manager from [facility] about PET scan.  He wanted you to look at it prior to biopsy tomorrow.  I will put in your chair to look at.

## 2016-11-07 NOTE — Telephone Encounter (Signed)
Son notified  He is asked to keep the appt tomorrow for omental biopsy

## 2016-11-07 NOTE — Telephone Encounter (Signed)
Looks the same as what our radiologists said. I don't thhink it changes anything - plan on biopsy of the omental nodules that are suspicious for malignancy.  I cannot tell if he wanted a call back - if you think he did you can let him know.

## 2016-11-08 ENCOUNTER — Ambulatory Visit (HOSPITAL_COMMUNITY)
Admission: RE | Admit: 2016-11-08 | Discharge: 2016-11-08 | Disposition: A | Discharge status: Home / Self Care | Payer: Medicare Other | Source: Ambulatory Visit | Attending: Internal Medicine | Admitting: Internal Medicine

## 2016-11-08 ENCOUNTER — Other Ambulatory Visit: Payer: Self-pay | Admitting: Internal Medicine

## 2016-11-08 ENCOUNTER — Encounter (HOSPITAL_COMMUNITY): Payer: Self-pay

## 2016-11-08 ENCOUNTER — Other Ambulatory Visit (HOSPITAL_COMMUNITY): Payer: Self-pay | Admitting: Interventional Radiology

## 2016-11-08 DIAGNOSIS — E785 Hyperlipidemia, unspecified: Secondary | ICD-10-CM | POA: Diagnosis not present

## 2016-11-08 DIAGNOSIS — Z79899 Other long term (current) drug therapy: Secondary | ICD-10-CM | POA: Diagnosis not present

## 2016-11-08 DIAGNOSIS — R180 Malignant ascites: Secondary | ICD-10-CM | POA: Insufficient documentation

## 2016-11-08 DIAGNOSIS — M899 Disorder of bone, unspecified: Secondary | ICD-10-CM | POA: Insufficient documentation

## 2016-11-08 DIAGNOSIS — I77811 Abdominal aortic ectasia: Secondary | ICD-10-CM | POA: Insufficient documentation

## 2016-11-08 DIAGNOSIS — I2510 Atherosclerotic heart disease of native coronary artery without angina pectoris: Secondary | ICD-10-CM | POA: Insufficient documentation

## 2016-11-08 DIAGNOSIS — Z87891 Personal history of nicotine dependence: Secondary | ICD-10-CM | POA: Insufficient documentation

## 2016-11-08 DIAGNOSIS — C801 Malignant (primary) neoplasm, unspecified: Secondary | ICD-10-CM | POA: Insufficient documentation

## 2016-11-08 DIAGNOSIS — R933 Abnormal findings on diagnostic imaging of other parts of digestive tract: Secondary | ICD-10-CM

## 2016-11-08 DIAGNOSIS — R911 Solitary pulmonary nodule: Secondary | ICD-10-CM | POA: Insufficient documentation

## 2016-11-08 DIAGNOSIS — I700 Atherosclerosis of aorta: Secondary | ICD-10-CM | POA: Insufficient documentation

## 2016-11-08 DIAGNOSIS — C482 Malignant neoplasm of peritoneum, unspecified: Secondary | ICD-10-CM | POA: Diagnosis not present

## 2016-11-08 DIAGNOSIS — R109 Unspecified abdominal pain: Secondary | ICD-10-CM | POA: Diagnosis not present

## 2016-11-08 DIAGNOSIS — K668 Other specified disorders of peritoneum: Secondary | ICD-10-CM | POA: Diagnosis not present

## 2016-11-08 DIAGNOSIS — R188 Other ascites: Secondary | ICD-10-CM | POA: Diagnosis present

## 2016-11-08 DIAGNOSIS — Z7982 Long term (current) use of aspirin: Secondary | ICD-10-CM | POA: Diagnosis not present

## 2016-11-08 LAB — CBC
HEMATOCRIT: 38.4 % — AB (ref 39.0–52.0)
Hemoglobin: 12.3 g/dL — ABNORMAL LOW (ref 13.0–17.0)
MCH: 27.8 pg (ref 26.0–34.0)
MCHC: 32 g/dL (ref 30.0–36.0)
MCV: 86.7 fL (ref 78.0–100.0)
PLATELETS: 459 10*3/uL — AB (ref 150–400)
RBC: 4.43 MIL/uL (ref 4.22–5.81)
RDW: 13 % (ref 11.5–15.5)
WBC: 15.2 10*3/uL — ABNORMAL HIGH (ref 4.0–10.5)

## 2016-11-08 LAB — APTT: aPTT: 34 seconds (ref 24–36)

## 2016-11-08 LAB — PROTIME-INR
INR: 1.23
Prothrombin Time: 15.6 seconds — ABNORMAL HIGH (ref 11.4–15.2)

## 2016-11-08 MED ORDER — FENTANYL CITRATE (PF) 100 MCG/2ML IJ SOLN
INTRAMUSCULAR | Status: AC
  Filled 2016-11-08: qty 4

## 2016-11-08 MED ORDER — MIDAZOLAM HCL 2 MG/2ML IJ SOLN
INTRAMUSCULAR | Status: AC
  Filled 2016-11-08: qty 6

## 2016-11-08 MED ORDER — SODIUM CHLORIDE 0.9 % IV SOLN
INTRAVENOUS | Status: DC
  Administered 2016-11-08: 09:00:00 via INTRAVENOUS

## 2016-11-08 NOTE — Consult Note (Signed)
Chief Complaint: Patient was seen in consultation today for CT-guided omental mass biopsy  Referring Physician(s): Gessner,Carl E  Supervising Physician: Sandi Mariscal  Patient Status: [facility]  History of Present Illness: Nicholas Diaz is a 83 y.o. male with recent history of abdominal pain/distention, nausea, vomiting, leukocytosis and recent CT revealing right lower lobe pulmonary nodule, markedly distended stomach concerning for gastric outlet obstruction, mild to moderate bilateral hydroureteronephrosis , thickened urinary bladder, ascites, mesenteric edema and thickened omentum. PET scan performed on 10/25/16 revealed hypermetabolic right lower lobe pulmonary nodule, as well as hypermetabolic activity involving the proximal duodenum and adjacent retroperitoneal mesenteric soft tissue stranding. There was also hypermetabolic activity at the site of right abdominal omental soft tissue nodularity and mild pelvic peritoneal soft tissue thickening as well as subcentimeter hypermetabolic sclerotic bone lesions involving the left posterior seventh rib and right ilium. PSA is normal. He presents today for CT-guided omental mass biopsy for further evaluation.  Past Medical History:  Diagnosis Date  . Hyperlipidemia     Past Surgical History:  Procedure Laterality Date  . ESOPHAGOGASTRODUODENOSCOPY N/A 10/11/2016   Procedure: ESOPHAGOGASTRODUODENOSCOPY (EGD);  Surgeon: Gatha Mayer, MD;  Location: [facility];  Service: Endoscopy;  Laterality: N/A;  . HERNIA REPAIR Right     Allergies: Ivp dye [iodinated diagnostic agents]  Medications: Prior to Admission medications   Medication Sig Start Date End Date Taking? Authorizing Provider  feeding supplement, ENSURE ENLIVE, (ENSURE ENLIVE) LIQD Take 237 mLs by mouth 3 (three) times daily between meals. 10/14/16  Yes Florencia Reasons, MD  furosemide (LASIX) 20 MG tablet Take 1 tablet (20 mg total) by mouth daily as needed. 11/06/16  Yes Gatha Mayer, MD   metoCLOPramide (REGLAN) 5 MG tablet Take 1 tablet (5 mg total) by mouth 3 (three) times daily before meals. 10/17/16  Yes Gatha Mayer, MD  omeprazole (PRILOSEC) 40 MG capsule Take 1 capsule (40 mg total) by mouth 2 (two) times daily. 10/14/16 11/13/16 Yes Florencia Reasons, MD  polyethylene glycol (MIRALAX / GLYCOLAX) packet Take 17 g by mouth 2 (two) times daily. Hold if diarrhea 10/13/16  Yes Florencia Reasons, MD  aspirin EC 81 MG tablet Take 81 mg by mouth daily.     Historical Provider, MD  Cholecalciferol (VITAMIN D-1000 MAX ST) 1000 units tablet Take 1,000 Units by mouth daily.     Historical Provider, MD  ezetimibe-simvastatin (VYTORIN) 10-10 MG per tablet Take 1 tablet by mouth at bedtime.    Historical Provider, MD  Multiple Vitamin (MULTI-VITAMINS) TABS Take 1 tablet by mouth daily.     Historical Provider, MD  Omega-3 Fatty Acids (FISH OIL PO) Take 1 capsule by mouth daily.     Historical Provider, MD  polyvinyl alcohol (LIQUIFILM TEARS) 1.4 % ophthalmic solution Place 1 drop into both eyes daily as needed for dry eyes    Historical Provider, MD     Family History  Problem Relation Age of Onset  . Colon cancer Paternal Grandmother     Social History   Social History  . Marital status: Widowed    Spouse name: N/A  . Number of children: 3  . Years of education: N/A   Occupational History  . retired    Social History Main Topics  . Smoking status: Former Smoker    Types: Cigarettes, Pipe    Quit date: 10/09/1978  . Smokeless tobacco: Never Used  . Alcohol use No  . Drug use: No  . Sexual activity: Not  Asked   Other Topics Concern  . None   Social History Narrative  . None      Review of Systems denies fever, chills, pain, or abnormal bleeding. He has had weight loss, constipation, occasional cough, abdominal/distention as well as occasional vomiting.  Vital Signs: BP (!) 174/98 (BP Location: Right Arm)   Pulse (!) 107   Temp 98.1 F (36.7 C) (Oral)   Resp 18   SpO2 98%    Physical Exam  awake, alert. Chest with diminished breath sounds right base, left clear. Heart with regular rate and rhythm. Abdomen tense/distended, few bowel sounds, not significantly tender. Lower extremities with 2-3+ edema bilaterally.  Mallampati Score:     Imaging: Ct Chest W Contrast  Result Date: 10/11/2016 CLINICAL DATA:  Abdominal pain and distension, nausea and vomiting beginning on October 02, 2016. Leukocytosis. History of RIGHT inguinal hernia repair, hyperlipidemia. EXAM: CT CHEST, ABDOMEN, AND PELVIS WITH CONTRAST TECHNIQUE: Multidetector CT imaging of the chest, abdomen and pelvis was performed following the standard protocol during bolus administration of intravenous contrast. CONTRAST:  100 cc Isovue 300 COMPARISON:  CT abdomen and pelvis September 28, 2016. FINDINGS: CT CHEST FINDINGS CARDIOVASCULAR: Aneurysmal ascending aorta at 4.2 cm with moderate calcific atherosclerosis. Heart size is normal. Mild coronary artery calcifications, annular calcifications. No pericardial effusion. MEDIASTINUM/NODES: No mediastinal mass. No lymphadenopathy by CT size criteria. Normal appearance of thoracic esophagus though not tailored for evaluation. LUNGS/PLEURA: 3 mm ground-glass pulmonary nodule RIGHT middle lobe. Spiculated solid 9 mm RIGHT lower lobe pulmonary nodule (series 4, image 58/125). Small RIGHT pleural effusion. Tracheobronchial tree is patent, mild bronchial wall thickening. MUSCULOSKELETAL: Included soft tissues and included osseous structures appear normal. CT ABDOMEN AND PELVIS FINDINGS HEPATOBILIARY: Calcified granuloma of liver, minimal biliary dilatation. Contracted gallbladder. PANCREAS: Normal. SPLEEN: Normal. ADRENALS/URINARY TRACT: Kidneys are orthotopic, persistently delayed LEFT nephrogram. No nephrolithiasis, hydronephrosis or solid renal masses. Mild to moderate bilateral hydroureteronephrosis. Normal contrast excretion RIGHT kidney on delayed, non on the LEFT. Urinary  bladder is mild eccentric posterior bladder wall thickening up to 7 mm. No intravesicular calculi. Normal adrenal glands. STOMACH/BOWEL: Markedly fluid distended stomach. Moderate colonic diverticulosis. Moderate amount of retained large bowel stool. The small bowel are normal in course and caliber without inflammatory changes. Persistent fat stranding pancreas and duodenum sweep. Normal appendix. VASCULAR/LYMPHATIC: Chronic infrarenal aortic dissection in ectasia measuring up to 2.7 cm. No lymphadenopathy by CT size criteria. Mesenteric edema. Mildly thickened omentum. REPRODUCTIVE: Prostate is enlarged, 5 cm in transaxial dimension invading the base the urinary bladder. OTHER: Increasing moderate amount of ascites. No intraperitoneal free air. MUSCULOSKELETAL: Non-acute. Small fat containing inguinal hernia. Subcentimeter sclerotic focus RIGHT iliac bone, a additional smaller sclerotic foci in the pelvis. Osteopenia. IMPRESSION: CT CHEST: Mild bronchial wall thickening can be seen with reactive airway disease or bronchitis without pneumonia. **An incidental finding of potential clinical significance has been found. 4.2 cm aneurysmal ascending aorta. Recommend annual imaging followup by CTA or MRA. This recommendation follows 2010 ACCF/AHA/AATS/ACR/ASA/SCA/SCAI/SIR/STS/SVM Guidelines for the Diagnosis and Management of Patients with Thoracic Aortic Disease. Circulation. 2010; [id number]** **An incidental finding of potential clinical significance has been found. 9 mm spiculated solid RIGHT lower lobe pulmonary nodule. Consider PET CT, CT or tissue sampling at 3 months.** CT ABDOMEN AND PELVIS: Markedly distended stomach concerning for gastric outlet obstruction. Persistent fat stranding about the head of the pancreas and duodenum sweep, possible duodenitis. Consider endoscopy. Mild to moderate bilateral hydroureteronephrosis and chronically delayed LEFT renal function. Eccentrically thickened urinary  bladder,  recommend cystoscopy. Increasing small to moderate amount of ascites and mesenteric edema. Mildly thickened omentum, which may be reactive though, associated with neoplasm. Electronically Signed   By: Elon Alas M.D.   On: 10/11/2016 02:52   Ct Abdomen Pelvis W Contrast  Result Date: 10/11/2016 CLINICAL DATA:  Abdominal pain and distension, nausea and vomiting beginning on October 02, 2016. Leukocytosis. History of RIGHT inguinal hernia repair, hyperlipidemia. EXAM: CT CHEST, ABDOMEN, AND PELVIS WITH CONTRAST TECHNIQUE: Multidetector CT imaging of the chest, abdomen and pelvis was performed following the standard protocol during bolus administration of intravenous contrast. CONTRAST:  100 cc Isovue 300 COMPARISON:  CT abdomen and pelvis September 28, 2016. FINDINGS: CT CHEST FINDINGS CARDIOVASCULAR: Aneurysmal ascending aorta at 4.2 cm with moderate calcific atherosclerosis. Heart size is normal. Mild coronary artery calcifications, annular calcifications. No pericardial effusion. MEDIASTINUM/NODES: No mediastinal mass. No lymphadenopathy by CT size criteria. Normal appearance of thoracic esophagus though not tailored for evaluation. LUNGS/PLEURA: 3 mm ground-glass pulmonary nodule RIGHT middle lobe. Spiculated solid 9 mm RIGHT lower lobe pulmonary nodule (series 4, image 58/125). Small RIGHT pleural effusion. Tracheobronchial tree is patent, mild bronchial wall thickening. MUSCULOSKELETAL: Included soft tissues and included osseous structures appear normal. CT ABDOMEN AND PELVIS FINDINGS HEPATOBILIARY: Calcified granuloma of liver, minimal biliary dilatation. Contracted gallbladder. PANCREAS: Normal. SPLEEN: Normal. ADRENALS/URINARY TRACT: Kidneys are orthotopic, persistently delayed LEFT nephrogram. No nephrolithiasis, hydronephrosis or solid renal masses. Mild to moderate bilateral hydroureteronephrosis. Normal contrast excretion RIGHT kidney on delayed, non on the LEFT. Urinary bladder is mild  eccentric posterior bladder wall thickening up to 7 mm. No intravesicular calculi. Normal adrenal glands. STOMACH/BOWEL: Markedly fluid distended stomach. Moderate colonic diverticulosis. Moderate amount of retained large bowel stool. The small bowel are normal in course and caliber without inflammatory changes. Persistent fat stranding pancreas and duodenum sweep. Normal appendix. VASCULAR/LYMPHATIC: Chronic infrarenal aortic dissection in ectasia measuring up to 2.7 cm. No lymphadenopathy by CT size criteria. Mesenteric edema. Mildly thickened omentum. REPRODUCTIVE: Prostate is enlarged, 5 cm in transaxial dimension invading the base the urinary bladder. OTHER: Increasing moderate amount of ascites. No intraperitoneal free air. MUSCULOSKELETAL: Non-acute. Small fat containing inguinal hernia. Subcentimeter sclerotic focus RIGHT iliac bone, a additional smaller sclerotic foci in the pelvis. Osteopenia. IMPRESSION: CT CHEST: Mild bronchial wall thickening can be seen with reactive airway disease or bronchitis without pneumonia. **An incidental finding of potential clinical significance has been found. 4.2 cm aneurysmal ascending aorta. Recommend annual imaging followup by CTA or MRA. This recommendation follows 2010 ACCF/AHA/AATS/ACR/ASA/SCA/SCAI/SIR/STS/SVM Guidelines for the Diagnosis and Management of Patients with Thoracic Aortic Disease. Circulation. 2010; [id number]** **An incidental finding of potential clinical significance has been found. 9 mm spiculated solid RIGHT lower lobe pulmonary nodule. Consider PET CT, CT or tissue sampling at 3 months.** CT ABDOMEN AND PELVIS: Markedly distended stomach concerning for gastric outlet obstruction. Persistent fat stranding about the head of the pancreas and duodenum sweep, possible duodenitis. Consider endoscopy. Mild to moderate bilateral hydroureteronephrosis and chronically delayed LEFT renal function. Eccentrically thickened urinary bladder, recommend  cystoscopy. Increasing small to moderate amount of ascites and mesenteric edema. Mildly thickened omentum, which may be reactive though, associated with neoplasm. Electronically Signed   By: Elon Alas M.D.   On: 10/11/2016 02:52   Nm Pet Image Initial (pi) Skull Base To Thigh  Result Date: 10/25/2016 CLINICAL DATA:  Initial treatment strategy for right lower pulmonary nodule. EXAM: NUCLEAR MEDICINE PET SKULL BASE TO THIGH TECHNIQUE: 9.7 mCi F-18 FDG was injected  intravenously. Full-ring PET imaging was performed from the skull base to thigh after the radiotracer. CT data was obtained and used for attenuation correction and anatomic localization. FASTING BLOOD GLUCOSE:  Value: 122 mg/dl COMPARISON:  CT on 10/11/2016 FINDINGS: NECK No hypermetabolic lymph nodes in the neck. CHEST No hypermetabolic mediastinal or hilar nodes. No other hypermetabolic lymph nodes within the thorax. Diffuse FDG uptake throughout the thoracic esophagus, consistent with esophagitis, and likely secondary to reflux given gastric dilatation described below. 9 mm pulmonary nodule in the superior segment of the right hepatic lobe has metabolic activity with SUV max of 2.8. No other pulmonary nodules seen on CT. Tiny right pleural effusion it is unchanged since previous CT and shows no associated metabolic activity. ABDOMEN/PELVIS No abnormal hypermetabolic activity within the liver, pancreas, adrenal glands, or spleen. No hypermetabolic lymph nodes in the abdomen or pelvis. Stomach remains dilated and fluid filled, suspicious for gastric outlet obstruction. Hypermetabolic activity is seen involving the proximal duodenum. Adjacent retroperitoneal and mesenteric soft tissue stranding also shows metabolic activity. Mild abdominal and pelvic ascites shows no significant change compared to recent CT. Right abdominal omental soft tissue nodularity is seen with hypermetabolic activity and SUV max of 5.5. Mild hypermetabolic activity is also  seen along the peritoneal surfaces within the pelvis. Differential diagnosis includes peritoneal carcinomatosis and peritonitis. Stable mildly enlarged prostate, without focal hypermetabolic activity. Mild diffuse bladder wall thickening, likely due to chronic bladder outlet obstruction. Sigmoid diverticulosis again demonstrated, without evidence of diverticulitis. Stable mild bilateral hydronephrosis. Aortic atherosclerosis. SKELETON Sub-cm sclerotic lesion in the left posterior seventh rib shows hypermetabolic activity, with SUV max of 4.5. Sub-cm sclerotic lesion in the right ilium has SUV max of 5.6. IMPRESSION: 9 mm right lower lobe pulmonary nodule shows FDG uptake with SUV max of 2.8, suspicious for primary bronchogenic carcinoma. Hypermetabolic activity involving the proximal duodenum, and adjacent retroperitoneal and mesenteric soft tissue stranding. These findings may be due to duodenitis, although neoplasm cannot definitely be excluded. Persistent dilatation of stomach, suspicious for gastric outlet obstruction. Consider upper endoscopy for further evaluation. Hypermetabolic activity at site of right abdominal omental soft tissue nodularity, and mild pelvic peritoneal soft tissue thickening. Differential diagnosis includes peritoneal carcinomatosis and peritonitis. Mild ascites. Sub-cm hypermetabolic sclerotic bone lesions involving the left posterior seventh rib and right ilium. Bone metastases cannot be excluded. Recommend correlation with PSA level. Electronically Signed   By: Earle Gell M.D.   On: 10/25/2016 15:20   US Abdomen Limited  Result Date: 10/25/2016 CLINICAL DATA:  Ascites EXAM: US ABDOMEN LIMITED - for paracentesis COMPARISON:  CT scan 10/11/2016 FINDINGS: Ultrasound examination 4 quadrants of the abdomen demonstrates trace perisplenic ascites. No confluent free fluid for safe paracentesis. IMPRESSION: Trace perisplenic ascites without enough fluid for safe paracentesis. Electronically  Signed   By: Lahoma Crocker M.D.   On: 10/25/2016 09:48   Dg Abd 2 Views  Result Date: 10/18/2016 CLINICAL DATA:  Vomiting for 10 days, constipation, diarrhea EXAM: ABDOMEN - 2 VIEW COMPARISON:  CT scan 1/ 3/18 FINDINGS: There is normal small bowel gas pattern. Significant gastric distension with fluid. Gastroparesis or significant gastric ileus cannot be excluded. Clinical correlation is necessary. Some colonic gas noted in right colon and transverse colon. No free abdominal air. IMPRESSION: Normal small bowel gas pattern. Significant gastric distension with fluid. Gastroparesis or significant gastric ileus cannot be excluded clinical correlation is necessary. No evidence of free abdominal air. Electronically Signed   By: Lahoma Crocker M.D.   On:  10/18/2016 08:22    Labs:  CBC:  Recent Labs  10/11/16 1926 10/12/16 0359 10/13/16 0411 11/08/16 0918  WBC 14.3* 11.3* 9.5 15.2*  HGB 10.9* 10.2* 10.9* 12.3*  HCT 33.4* 31.5* 33.6* 38.4*  PLT 348 299 320 459*    COAGS:  Recent Labs  10/10/16 2137 11/08/16 0918  INR 1.20 1.23  APTT  --  34    BMP:  Recent Labs  09/27/16 1151 10/10/16 1733 10/12/16 0359 10/13/16 0411  NA 140 135 140 136  K 4.0 3.7 3.8 3.8  CL 97 95* 105 102  CO2 35* 27 27 26   GLUCOSE 128* 128* 92 98  BUN 12 19 22* 16  CALCIUM 10.4 10.3 8.8* 8.8*  CREATININE 1.34 1.27* 1.31* 1.22  GFRNONAA  --  48* 46* 50*  GFRAA  --  56* 54* 58*    LIVER FUNCTION TESTS:  Recent Labs  09/27/16 1151 10/10/16 1733 10/12/16 0359  BILITOT 0.5 0.4 0.5  AST 21 32 28  ALT 17 50 34  ALKPHOS 90 119 84  PROT 7.2 7.1 5.2*  ALBUMIN 4.0 3.9 2.9*    TUMOR MARKERS: No results for input(s): AFPTM, CEA, CA199, CHROMGRNA in the last 8760 hours.  Assessment and Plan: 83 y.o. male with recent history of abdominal pain/distention, nausea, vomiting, leukocytosis and recent CT revealing right lower lobe pulmonary nodule, markedly distended stomach concerning for gastric outlet  obstruction, mild to moderate bilateral hydroureteronephrosis , thickened urinary bladder, ascites, mesenteric edema and thickened omentum. PET scan performed on 10/25/16 revealed hypermetabolic right lower lobe pulmonary nodule, as well as hypermetabolic activity involving the proximal duodenum and adjacent retroperitoneal mesenteric soft tissue stranding. There was also hypermetabolic activity at the site of right abdominal omental soft tissue nodularity and mild pelvic peritoneal soft tissue thickening as well as subcentimeter hypermetabolic sclerotic bone lesions involving the left posterior seventh rib and right ilium. PSA is normal. He presents today for CT-guided omental mass biopsy for further evaluation.Risks and benefits discussed with the patient/sons including, but not limited to bleeding, infection, damage to adjacent structures or low yield requiring additional tests. All of the patient's questions were answered, patient is agreeable to proceed. Consent signed and in chart.    Thank you for this interesting consult.  I greatly enjoyed meeting Nicholas Diaz and look forward to participating in their care.  A copy of this report was sent to the requesting provider on this date.  Electronically Signed: D. Rowe Robert 11/08/2016, 9:53 AM   I spent a total of 30 minutes   in face to face in clinical consultation, greater than 50% of which was counseling/coordinating care for CT-guided omental mass biopsy

## 2016-11-08 NOTE — Procedures (Signed)
Successful CT and US guided paracentesis yielding 2.4 L of serous ascitic fluid. Sample sent to laboratory as requested. EBL: None No immediate post procedural complications.   Ronny Bacon, MD Pager #: [phone]

## 2016-11-08 NOTE — Discharge Instructions (Signed)
Paracentesis Introduction Paracentesis is a procedure to remove excess fluid (ascites) from the belly (abdomen). Ascites can result from certain conditions, such as infection, inflammation, abdominal injury, heart failure, chronic scarring of the liver (cirrhosis), or cancer. Ascites is removed using a needle that is inserted through the skin and tissue into the abdomen. This procedure may be done:  To determine the cause of the ascites.  To relieve symptoms that are caused by the ascites, such as pain or shortness of breath.  To see if there is bleeding after an abdominal injury. Tell a health care provider about:  Any allergies you have.  All medicines you are taking, including vitamins, herbs, eye drops, creams, and over-the-counter medicines.  Any problems you or family members have had with anesthetic medicines.  Any blood disorders you have.  Any surgeries you have had.  Any medical conditions you have.  Whether you are pregnant or may be pregnant. What are the risks? Generally, this is a safe procedure. However, problems may occur, including:  Infection.  Bleeding.  Injury to an abdominal organ, such as the bowel (large intestine), liver, spleen, or bladder.  Low blood pressure (hypotension).  Spreading of cancer, if there are cancer cells in the abdominal fluid.  Mental status changes in people who have liver disease. These changes would be caused by shifts in the balance of fluids and minerals (electrolytes) in the body. What happens before the procedure?  Ask your health care provider about:  Changing or stopping your regular medicines. This is especially important if you are taking diabetes medicines or blood thinners.  Taking medicines such as aspirin and ibuprofen. These medicines can thin your blood. Do not take these medicines before your procedure if your health care provider instructs you not to.  A blood sample may be done to determine your blood  clotting time.  You will be asked to urinate. What happens during the procedure?  You may be asked to lie on your back with your head raised (elevated).  To reduce your risk of infection:  Your health care team will wash or sanitize their hands.  Your skin will be washed with soap.  You will be given a medicine to numb the area (local anesthetic).  Your abdominal skin will be punctured with a needle or a scalpel.  A drainage tube will be inserted through the puncture site. Fluid will drain through the tube into a container.  After enough fluid has been removed, the tube will be removed.  A sample of the fluid will be sent for examination.  A bandage (dressing) will be placed over the puncture site. The procedure may vary among health care providers and hospitals. What happens after the procedure?  It is your responsibility to get your test results. Ask your health care provider or the department performing the test when your results will be ready. This information is not intended to replace advice given to you by your health care provider. Make sure you discuss any questions you have with your health care provider. Document Released: 04/10/2005 Document Revised: 03/02/2016 Document Reviewed: 12/08/2014  2017 Elsevier Paracentesis, Care After Introduction Refer to this sheet in the next few weeks. These instructions provide you with information about caring for yourself after your procedure. Your health care provider may also give you more specific instructions. Your treatment has been planned according to current medical practices, but problems sometimes occur. Call your health care provider if you have any problems or questions after your procedure.  What can I expect after the procedure? After your procedure, it is common to have a small amount of clear fluid coming from the puncture site. Follow these instructions at home:  Return to your normal activities as told by your  health care provider. Ask your health care provider what activities are safe for you.  Take over-the-counter and prescription medicines only as told by your health care provider.  Do not take baths, swim, or use a hot tub until your health care provider approves.  Follow instructions from your health care provider about:  How to take care of your puncture site.  When and how you should change your bandage (dressing).  When you should remove your dressing.  Check your puncture area every day signs of infection. Watch for:  Redness, swelling, or pain.  Fluid, blood, or pus.  Keep all follow-up visits as told by your health care provider. This is important. Contact a health care provider if:  You have redness, swelling, or pain at your puncture site.  You start to have more clear fluid coming from your puncture site.  You have blood or pus coming from your puncture site.  You have chills.  You have a fever. Get help right away if:  You develop chest pain or shortness of breath.  You develop increasing pain, discomfort, or swelling in your abdomen.  You feel dizzy or light-headed or you pass out. This information is not intended to replace advice given to you by your health care provider. Make sure you discuss any questions you have with your health care provider. Document Released: 02/09/2015 Document Revised: 03/02/2016 Document Reviewed: 12/08/2014  2017 Elsevier

## 2016-11-09 ENCOUNTER — Encounter: Payer: Self-pay | Admitting: Internal Medicine

## 2016-11-10 ENCOUNTER — Other Ambulatory Visit: Payer: Self-pay

## 2016-11-10 DIAGNOSIS — C801 Malignant (primary) neoplasm, unspecified: Secondary | ICD-10-CM

## 2016-11-10 NOTE — Progress Notes (Signed)
I have spoken to patient, Dr. Joylene Draft (PCP) and his children and plan is to refer to oncology. Patient will cancel any upcoming [facility] appts/ We might need to rescope ? EUS and think we could do next week if needed. Hoping he could see Dr. Benay Spice or Burr Medico next week to start oncology evaluation and consideration for w/u. I have cced [facility]

## 2016-11-10 NOTE — Progress Notes (Signed)
Cytology of ascites shows carcinoma Staining non-specific but suggests upper GI sourcPrior gastric ulcer bx benign

## 2016-11-12 DIAGNOSIS — R918 Other nonspecific abnormal finding of lung field: Secondary | ICD-10-CM | POA: Diagnosis not present

## 2016-11-12 DIAGNOSIS — K259 Gastric ulcer, unspecified as acute or chronic, without hemorrhage or perforation: Secondary | ICD-10-CM | POA: Diagnosis not present

## 2016-11-12 DIAGNOSIS — R1110 Vomiting, unspecified: Secondary | ICD-10-CM | POA: Diagnosis not present

## 2016-11-12 DIAGNOSIS — K219 Gastro-esophageal reflux disease without esophagitis: Secondary | ICD-10-CM | POA: Diagnosis not present

## 2016-11-12 DIAGNOSIS — Z6828 Body mass index (BMI) 28.0-28.9, adult: Secondary | ICD-10-CM | POA: Diagnosis not present

## 2016-11-12 DIAGNOSIS — I7100 Dissection of unspecified site of aorta: Secondary | ICD-10-CM | POA: Diagnosis not present

## 2016-11-12 DIAGNOSIS — N1330 Unspecified hydronephrosis: Secondary | ICD-10-CM | POA: Diagnosis not present

## 2016-11-13 ENCOUNTER — Telehealth: Payer: Self-pay | Admitting: Internal Medicine

## 2016-11-13 NOTE — Telephone Encounter (Addendum)
We can though I do believe we have oncologists that can handle it here and would see if we can get him seen here first.  One main concern I have is getting care far away can be problematic   So clarify - do they want here or [facility]?  We should not do both.  Let them know that our oncologists often get advice from the outside if necessary.

## 2016-11-14 ENCOUNTER — Telehealth: Payer: Self-pay | Admitting: Oncology

## 2016-11-14 NOTE — Telephone Encounter (Signed)
Son [name] confirmed appt and verified demo.  No letter requested.

## 2016-11-14 NOTE — Telephone Encounter (Signed)
I spoke with son, Nicholas Diaz,  They will wait on oncology appt here.  I also spoke with the cancer center and they will be calling patient's son with an appt today.  I advised Nicholas Diaz will most likely be Friday.

## 2016-11-16 ENCOUNTER — Other Ambulatory Visit: Payer: Self-pay | Admitting: Internal Medicine

## 2016-11-17 ENCOUNTER — Encounter: Payer: Self-pay | Admitting: *Deleted

## 2016-11-17 ENCOUNTER — Other Ambulatory Visit: Payer: Self-pay | Admitting: Oncology

## 2016-11-17 ENCOUNTER — Ambulatory Visit (HOSPITAL_BASED_OUTPATIENT_CLINIC_OR_DEPARTMENT_OTHER): Payer: Medicare Other | Admitting: Oncology

## 2016-11-17 ENCOUNTER — Other Ambulatory Visit: Payer: Self-pay | Admitting: *Deleted

## 2016-11-17 ENCOUNTER — Ambulatory Visit (HOSPITAL_COMMUNITY)
Admission: RE | Admit: 2016-11-17 | Discharge: 2016-11-17 | Disposition: A | Discharge status: Home / Self Care | Payer: Medicare Other | Source: Ambulatory Visit | Attending: Oncology | Admitting: Oncology

## 2016-11-17 ENCOUNTER — Ambulatory Visit: Payer: Medicare Other | Admitting: Nutrition

## 2016-11-17 ENCOUNTER — Telehealth: Payer: Self-pay | Admitting: Oncology

## 2016-11-17 VITALS — BP 137/79 | HR 104 | Temp 98.1°F | Resp 18 | Wt 189.0 lb

## 2016-11-17 DIAGNOSIS — R180 Malignant ascites: Secondary | ICD-10-CM | POA: Insufficient documentation

## 2016-11-17 DIAGNOSIS — C801 Malignant (primary) neoplasm, unspecified: Secondary | ICD-10-CM | POA: Diagnosis not present

## 2016-11-17 DIAGNOSIS — M899 Disorder of bone, unspecified: Secondary | ICD-10-CM

## 2016-11-17 DIAGNOSIS — R634 Abnormal weight loss: Secondary | ICD-10-CM

## 2016-11-17 DIAGNOSIS — Z803 Family history of malignant neoplasm of breast: Secondary | ICD-10-CM

## 2016-11-17 DIAGNOSIS — R911 Solitary pulmonary nodule: Secondary | ICD-10-CM | POA: Diagnosis not present

## 2016-11-17 DIAGNOSIS — R1110 Vomiting, unspecified: Secondary | ICD-10-CM

## 2016-11-17 DIAGNOSIS — Z87891 Personal history of nicotine dependence: Secondary | ICD-10-CM | POA: Diagnosis not present

## 2016-11-17 DIAGNOSIS — R140 Abdominal distension (gaseous): Secondary | ICD-10-CM | POA: Diagnosis not present

## 2016-11-17 DIAGNOSIS — Z800 Family history of malignant neoplasm of digestive organs: Secondary | ICD-10-CM | POA: Diagnosis not present

## 2016-11-17 DIAGNOSIS — R609 Edema, unspecified: Secondary | ICD-10-CM | POA: Diagnosis not present

## 2016-11-17 NOTE — Telephone Encounter (Signed)
Patient not scheduled for 2/16 at time of scheduling visit per availability of [facility] and [facility]. Will call patient with scheduled appointment time once availability of schedules determined.

## 2016-11-17 NOTE — Progress Notes (Signed)
Patient ID: Nicholas Diaz, male   DOB: [birth year], 83 y.o.   MRN: [id number] Pt presented to [place] dept today for therapeutic paracentesis. On limited US abd in all four quadrants there is only a small amount of ascites noted. Above d/w Dr. Benay Spice and decision made to cancel procedure today.

## 2016-11-17 NOTE — Progress Notes (Signed)
Signed order for paracentesis faxed to central scheduling at [phone]

## 2016-11-17 NOTE — Progress Notes (Signed)
[facility] New Patient Consult   Referring MD: Christoper Bushey 83 y.o.  10/15/1923    Reason for Referral: metastatic carcinoma   HPI: Mr. Micheletti reports feeling well until December 2017 when he noted  Abdominal "bloating"and constipation. He was seen by gastroenterologyAnn placed on a laxative regimen. A CT of the abdomen and pelvis on 09/28/2016 revealed a small amount of ascites, moderate gastric distention, and moderate stool within the cecum. He was admitted 1/02/2018with vomiting and increased abdominal distention.Dr. Leone Payor was consulted. An upper endoscopy revealed no evidence of obstruction. A pyloric ulcer was biopsied and the pathology returned benign.  repeat CT of the abdomen and pelvis with  a CT of the chest on 10/11/2016 revealed a spiculated 9 mm right lower lobe nodule. There is a persistently delayed left nephrogram with mild to moderate bilateral Hydrouretero nephrosis.the stomach was markedly distended.increased ascites. A small sclerotic focus was noted at theright iliac bone.there was persistent fat stranding at the head of the pancreas and the duodenal sweep.  He was discharged 10/13/2016. He was referred for an outpatient PET scan on 10/25/2016.A 9 mm pulmonary nodule is suspicious for a primary bronchogenic carcinoma. Hypermetabolic activity was noted at the proximal  Duodenum and adjacent soft tissue. Persistent dilatation of the stomach was noted. Hypermetabolic activity is noted at right abdominal omental nodularity. Subcentimeter hypermetabolic lesions were noted at the left seventh rib and right ilium.  [facility]. He continues to have emesis once or twice daily. The abdominal distention persists. No nausea.  A diagnostic paracentesis on 11/08/2016 revealed malignant cells consistent with carcinoma. The tumor cells stained positive for cytokeratin 7, MOC-31, and focally positive  for CD X-2, raising the possibility of an upper gastrointestinal primary.  Past Medical History:  Diagnosis Date  . Hyperlipidemia     .  Macular degeneration  Past Surgical History:  Procedure Laterality Date  . ESOPHAGOGASTRODUODENOSCOPY N/A 10/11/2016   Procedure: ESOPHAGOGASTRODUODENOSCOPY (EGD);  Surgeon: Iva Boop, MD;  Location: [facility];  Service: Endoscopy;  Laterality: N/A;  . HERNIA REPAIR Right 1988    Medications: Reviewed  Allergies:  Allergies  Allergen Reactions  . Ivp Dye [Iodinated Diagnostic Agents]     Dye that was used for macular degeneration     Family history: his paternal grandmother had colon cancer. His daughter and a niece had breast cancer.  Social History:   He lives in [place]. He is a retired Systems developer for [employer]. He quit smoking cigarettes in his 59s and quit smoking a pipe in 2006. He has been a blood donor. No risk factor for HIV or hepatitis. Rare alcohol use. 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     ROS:   Positives include:daily emesis, early satiety, abdominal distention, decreased urinary stream, leg and foot swelling  A complete ROS was otherwise negative.  Physical Exam:  Blood pressure 137/79, pulse (!) 104, temperature 98.1 F (36.7 C), temperature source Oral, resp. rate 18, weight 189 lb (85.7 kg), SpO2 100 %.  HEENT: oropharynx without visible mass, missing teeth,neck without mass Lungs: clear bilaterally Cardiac: regular rate and rhythm Abdomen: distended, no mass, no hepatosplenomegaly GU: testes without mass  Vascular: pitting edema to the waist bilaterally Lymph nodes: no cervical, supraclavicular, axillary, or inguinal nodes Neurologic: alert and oriented, the motor exam appears intact in the upper and lower extremities Skin: no rash Musculoskeletal: no spine tenderness   LAB:  CBC  Lab Results  Component Value Date   WBC 15.2 (H) 11/08/2016   HGB 12.3 (L) 11/08/2016   HCT 38.4 (L)  11/08/2016   MCV 86.7 11/08/2016   PLT 459 (H) 11/08/2016   NEUTROABS 8.4 (H) 09/27/2016     CMP      Component Value Date/Time   NA 136 10/13/2016 0411   K 3.8 10/13/2016 0411   CL 102 10/13/2016 0411   CO2 26 10/13/2016 0411   GLUCOSE 98 10/13/2016 0411   BUN 16 10/13/2016 0411   CREATININE 1.22 10/13/2016 0411   CALCIUM 8.8 (L) 10/13/2016 0411   PROT 5.2 (L) 10/12/2016 0359   ALBUMIN 2.9 (L) 10/12/2016 0359   AST 28 10/12/2016 0359   ALT 34 10/12/2016 0359   ALKPHOS 84 10/12/2016 0359   BILITOT 0.5 10/12/2016 0359   GFRNONAA 50 (L) 10/13/2016 0411   GFRAA 58 (L) 10/13/2016 0411      Imaging:  PET images from 10/25/2016 reviewed with Mr. Belson and his family   Assessment/Plan:   1. Metastatic carcinoma   cytology from a paracentesis confirmed metastatic carcinoma  Upper endoscopy 10/11/2016-pyloric ulcer, biopsy negative for malignancy  PET scan 16/38/4536- hypermetabolicright lower lobe nodule, hypermetabolism at the proximal duodenum and adjacent soft tissue, dilated stomach, hypermetabolic omental nodularity, hypermetabolic rib and iliac lesions 2. intermittent vomiting secondary to gastric outlet obstruction  3. Weight loss  4. Lower extremity edema secondary to intra-abdominadhypoalbuminemia  Disposition:   Mr. Stambaugh has been diagnosed with metastatic carcinoma. No primary tumor site has been identified, though the clinical presentation and cytology are most consistent with a gastrointestinal primary. I discussed the differential diagnosis with Mr. Hannen and his family.No therapy will be curative.We discussed the indication for additional diagnostic testing in an attempt to define a primary tumor site.  I estimated his survival to be measured in months. This could potentially be extended by months if he were a candidate for systemic therapy. He is not a candidate for chemotherapy in his current condition.  He is symptomatic with abdominal distention  and emesis. This appears to be related to gastric outlet obstruction. Mr. Montfort and his family are interested in any intervention that may improve his symptoms.  I explained the difficulty relieving a bowel obstruction in the setting of carcinomatosis. We discussed a palliative gastrostomy tube.  I recommend Hospice care. He can be evaluated by surgery to consider the indication for a palliative procedure..  Mr. Kestler is scheduled for a second opinion in medical oncology at [facility] next week. He met with the [place] nutritionist today. He will return for an office visit here in 1 week.  60 minutes were spent with the patient today. The majority of the time was used for counseling and coordination of care.  Betsy Coder, MD  11/17/2016, 9:33 AM

## 2016-11-17 NOTE — Progress Notes (Signed)
Nicholas Diaz is a 83 y.o. male with recent history of abdominal pain/distention, nausea, vomiting, leukocytosis and recent CT revealing right lower lobe pulmonary nodule, markedly distended stomach concerning for gastric outlet obstruction, mild to moderate bilateral hydroureteronephrosis , thickened urinary bladder, ascites, mesenteric edema and thickened omentum. PET scan performed on 10/25/16 revealed hypermetabolic right lower lobe pulmonary nodule, as well as hypermetabolic activity involving the proximal duodenum and adjacent retroperitoneal mesenteric soft tissue stranding. There was also hypermetabolic activity at the site of right abdominal omental soft tissue nodularity and mild pelvic peritoneal soft tissue thickening as well as subcentimeter hypermetabolic sclerotic bone lesions involving the left posterior seventh rib and right ilium.  Past medical history includes hyperlipidemia, stage II chronic kidney disease.  Patient status post paracentesis with 2.4 L removed on January 31.  Medications include vitamin D, Lasix, Reglan, multivitamin, fish oil, Prilosec, and MiraLAX.  Labs include glucose 122.  Height: 5 feet 9 inches. Weight: 189 pounds. (reflects current ascites) Usual body weight: 205 pounds, December 20.  Patient currently denies nausea but does have vomiting almost daily.  He could not determine what causes the vomiting. Family concerned regarding patient's nutritional intake.  Nutrition diagnosis:  Inadequate oral intake related to abdominal ascites as evidenced by at least 15 pound weight loss and dietary recall revealing very small amounts of oral intake.  Intervention: Educated patient to consume small frequent meals and snacks throughout the day, focusing on high-calorie, high-protein foods. Provided suggestions and fact sheets. Encouraged patient to consume oral nutrition supplements to contribute overall calorie intake as well as hydration. Encouraged fluids  after meals. Provided samples of ensure enlive.. Questions were answered.  Teach back method used.  Contact information given.  Monitoring, evaluation, goals: Patient will tolerate increased oral intake to minimize loss of lean body mass.  Next visit: To be scheduled as needed.  **Disclaimer: This note was dictated with voice recognition software. Similar sounding words can inadvertently be transcribed and this note may contain transcription errors which may not have been corrected upon publication of note.**

## 2016-11-20 ENCOUNTER — Telehealth: Payer: Self-pay | Admitting: Oncology

## 2016-11-20 ENCOUNTER — Encounter: Payer: Self-pay | Admitting: Oncology

## 2016-11-20 NOTE — Telephone Encounter (Signed)
Appointments scheduled per [facility]/ 2/6 LOS. Patient notified. LVM

## 2016-11-22 DIAGNOSIS — R180 Malignant ascites: Secondary | ICD-10-CM | POA: Diagnosis not present

## 2016-11-22 DIAGNOSIS — C801 Malignant (primary) neoplasm, unspecified: Secondary | ICD-10-CM | POA: Diagnosis not present

## 2016-11-23 ENCOUNTER — Encounter: Payer: Self-pay | Admitting: Oncology

## 2016-11-23 ENCOUNTER — Telehealth: Payer: Self-pay | Admitting: Oncology

## 2016-11-23 DIAGNOSIS — K311 Adult hypertrophic pyloric stenosis: Secondary | ICD-10-CM | POA: Diagnosis not present

## 2016-11-23 NOTE — Telephone Encounter (Signed)
Pt power of attorney called to cxl 2/16 appts. appts to be resch at a later date once 2nd opinion is obtained

## 2016-11-24 ENCOUNTER — Ambulatory Visit: Payer: Medicare Other | Admitting: Oncology

## 2016-11-24 ENCOUNTER — Other Ambulatory Visit: Payer: Medicare Other

## 2016-11-24 DIAGNOSIS — Z87891 Personal history of nicotine dependence: Secondary | ICD-10-CM | POA: Diagnosis not present

## 2016-11-24 DIAGNOSIS — K922 Gastrointestinal hemorrhage, unspecified: Secondary | ICD-10-CM | POA: Diagnosis not present

## 2016-11-24 DIAGNOSIS — K311 Adult hypertrophic pyloric stenosis: Secondary | ICD-10-CM | POA: Diagnosis not present

## 2016-11-24 DIAGNOSIS — Z79899 Other long term (current) drug therapy: Secondary | ICD-10-CM | POA: Diagnosis not present

## 2016-11-24 DIAGNOSIS — R935 Abnormal findings on diagnostic imaging of other abdominal regions, including retroperitoneum: Secondary | ICD-10-CM | POA: Diagnosis not present

## 2016-11-24 DIAGNOSIS — E785 Hyperlipidemia, unspecified: Secondary | ICD-10-CM | POA: Diagnosis not present

## 2016-11-24 DIAGNOSIS — K3189 Other diseases of stomach and duodenum: Secondary | ICD-10-CM | POA: Diagnosis not present

## 2016-11-24 DIAGNOSIS — N182 Chronic kidney disease, stage 2 (mild): Secondary | ICD-10-CM | POA: Diagnosis not present

## 2016-11-24 DIAGNOSIS — H3530 Unspecified macular degeneration: Secondary | ICD-10-CM | POA: Diagnosis not present

## 2016-11-24 DIAGNOSIS — K219 Gastro-esophageal reflux disease without esophagitis: Secondary | ICD-10-CM | POA: Diagnosis not present

## 2016-11-29 DIAGNOSIS — C269 Malignant neoplasm of ill-defined sites within the digestive system: Secondary | ICD-10-CM | POA: Diagnosis present

## 2016-11-29 DIAGNOSIS — C786 Secondary malignant neoplasm of retroperitoneum and peritoneum: Secondary | ICD-10-CM | POA: Diagnosis present

## 2016-11-29 DIAGNOSIS — Z87891 Personal history of nicotine dependence: Secondary | ICD-10-CM | POA: Diagnosis not present

## 2016-11-29 DIAGNOSIS — Z6827 Body mass index (BMI) 27.0-27.9, adult: Secondary | ICD-10-CM | POA: Diagnosis not present

## 2016-11-29 DIAGNOSIS — Z515 Encounter for palliative care: Secondary | ICD-10-CM | POA: Diagnosis not present

## 2016-11-29 DIAGNOSIS — C7800 Secondary malignant neoplasm of unspecified lung: Secondary | ICD-10-CM | POA: Diagnosis present

## 2016-11-29 DIAGNOSIS — K311 Adult hypertrophic pyloric stenosis: Secondary | ICD-10-CM | POA: Diagnosis not present

## 2016-11-29 DIAGNOSIS — E860 Dehydration: Secondary | ICD-10-CM | POA: Diagnosis present

## 2016-11-29 DIAGNOSIS — R1114 Bilious vomiting: Secondary | ICD-10-CM | POA: Diagnosis not present

## 2016-11-29 DIAGNOSIS — Z66 Do not resuscitate: Secondary | ICD-10-CM | POA: Diagnosis not present

## 2016-11-29 DIAGNOSIS — J90 Pleural effusion, not elsewhere classified: Secondary | ICD-10-CM | POA: Diagnosis not present

## 2016-11-29 DIAGNOSIS — R180 Malignant ascites: Secondary | ICD-10-CM | POA: Diagnosis present

## 2016-11-29 DIAGNOSIS — R945 Abnormal results of liver function studies: Secondary | ICD-10-CM | POA: Diagnosis not present

## 2016-11-29 DIAGNOSIS — J910 Malignant pleural effusion: Secondary | ICD-10-CM | POA: Diagnosis not present

## 2016-11-29 DIAGNOSIS — K5900 Constipation, unspecified: Secondary | ICD-10-CM | POA: Diagnosis present

## 2016-11-29 DIAGNOSIS — N183 Chronic kidney disease, stage 3 (moderate): Secondary | ICD-10-CM | POA: Diagnosis present

## 2016-11-29 DIAGNOSIS — C7951 Secondary malignant neoplasm of bone: Secondary | ICD-10-CM | POA: Diagnosis present

## 2016-11-29 DIAGNOSIS — R112 Nausea with vomiting, unspecified: Secondary | ICD-10-CM | POA: Diagnosis not present

## 2016-11-29 DIAGNOSIS — E440 Moderate protein-calorie malnutrition: Secondary | ICD-10-CM | POA: Diagnosis present

## 2016-11-29 DIAGNOSIS — R1110 Vomiting, unspecified: Secondary | ICD-10-CM | POA: Diagnosis not present

## 2016-11-29 DIAGNOSIS — R1111 Vomiting without nausea: Secondary | ICD-10-CM | POA: Diagnosis not present

## 2016-11-29 DIAGNOSIS — H3530 Unspecified macular degeneration: Secondary | ICD-10-CM | POA: Diagnosis present

## 2016-11-29 DIAGNOSIS — K219 Gastro-esophageal reflux disease without esophagitis: Secondary | ICD-10-CM | POA: Diagnosis present

## 2016-11-29 DIAGNOSIS — R600 Localized edema: Secondary | ICD-10-CM | POA: Diagnosis not present

## 2016-11-29 DIAGNOSIS — I2510 Atherosclerotic heart disease of native coronary artery without angina pectoris: Secondary | ICD-10-CM | POA: Diagnosis present

## 2016-11-29 DIAGNOSIS — C801 Malignant (primary) neoplasm, unspecified: Secondary | ICD-10-CM | POA: Diagnosis not present

## 2016-11-29 DIAGNOSIS — C787 Secondary malignant neoplasm of liver and intrahepatic bile duct: Secondary | ICD-10-CM | POA: Diagnosis present

## 2016-11-29 DIAGNOSIS — I491 Atrial premature depolarization: Secondary | ICD-10-CM | POA: Diagnosis not present

## 2016-11-29 DIAGNOSIS — E785 Hyperlipidemia, unspecified: Secondary | ICD-10-CM | POA: Diagnosis present

## 2016-11-29 DIAGNOSIS — R609 Edema, unspecified: Secondary | ICD-10-CM | POA: Diagnosis not present

## 2016-11-29 DIAGNOSIS — Z803 Family history of malignant neoplasm of breast: Secondary | ICD-10-CM | POA: Diagnosis not present

## 2016-12-02 DIAGNOSIS — C801 Malignant (primary) neoplasm, unspecified: Secondary | ICD-10-CM | POA: Diagnosis not present

## 2016-12-02 DIAGNOSIS — R180 Malignant ascites: Secondary | ICD-10-CM | POA: Diagnosis not present

## 2016-12-02 DIAGNOSIS — C786 Secondary malignant neoplasm of retroperitoneum and peritoneum: Secondary | ICD-10-CM | POA: Diagnosis not present

## 2016-12-02 DIAGNOSIS — I2510 Atherosclerotic heart disease of native coronary artery without angina pectoris: Secondary | ICD-10-CM | POA: Diagnosis not present

## 2016-12-02 DIAGNOSIS — N183 Chronic kidney disease, stage 3 (moderate): Secondary | ICD-10-CM | POA: Diagnosis not present

## 2016-12-02 DIAGNOSIS — K219 Gastro-esophageal reflux disease without esophagitis: Secondary | ICD-10-CM | POA: Diagnosis not present

## 2016-12-02 DIAGNOSIS — R112 Nausea with vomiting, unspecified: Secondary | ICD-10-CM | POA: Diagnosis not present

## 2016-12-02 DIAGNOSIS — Z515 Encounter for palliative care: Secondary | ICD-10-CM | POA: Diagnosis not present

## 2016-12-02 DIAGNOSIS — C7951 Secondary malignant neoplasm of bone: Secondary | ICD-10-CM | POA: Diagnosis not present

## 2016-12-04 DIAGNOSIS — N183 Chronic kidney disease, stage 3 (moderate): Secondary | ICD-10-CM | POA: Diagnosis not present

## 2016-12-04 DIAGNOSIS — C801 Malignant (primary) neoplasm, unspecified: Secondary | ICD-10-CM | POA: Diagnosis not present

## 2016-12-04 DIAGNOSIS — C786 Secondary malignant neoplasm of retroperitoneum and peritoneum: Secondary | ICD-10-CM | POA: Diagnosis not present

## 2016-12-04 DIAGNOSIS — C7951 Secondary malignant neoplasm of bone: Secondary | ICD-10-CM | POA: Diagnosis not present

## 2016-12-04 DIAGNOSIS — R180 Malignant ascites: Secondary | ICD-10-CM | POA: Diagnosis not present

## 2016-12-04 DIAGNOSIS — I2510 Atherosclerotic heart disease of native coronary artery without angina pectoris: Secondary | ICD-10-CM | POA: Diagnosis not present

## 2016-12-05 ENCOUNTER — Telehealth: Payer: Self-pay | Admitting: *Deleted

## 2016-12-05 NOTE — Telephone Encounter (Signed)
Returned call to pt's son with appt for 2/28 @ 10 for IV fluids. Dr. Benay Spice will see pt while getting infusion. He voiced understanding.

## 2016-12-05 NOTE — Telephone Encounter (Signed)
Family member of patient called today requesting that patient get and order/appointment for fluid to be drawn off. Family states that patient is possibly dehydrated and would like for patient to be scheduled for possible fluids. Nurse can return call back  To [phone] Isaiah Serge)

## 2016-12-06 ENCOUNTER — Ambulatory Visit: Payer: Medicare Other | Admitting: Nurse Practitioner

## 2016-12-06 ENCOUNTER — Ambulatory Visit (HOSPITAL_BASED_OUTPATIENT_CLINIC_OR_DEPARTMENT_OTHER): Payer: Medicare Other | Admitting: Oncology

## 2016-12-06 ENCOUNTER — Ambulatory Visit (HOSPITAL_COMMUNITY)
Admission: RE | Admit: 2016-12-06 | Discharge: 2016-12-06 | Disposition: A | Discharge status: Home / Self Care | Payer: Medicare Other | Source: Ambulatory Visit | Attending: Oncology | Admitting: Oncology

## 2016-12-06 VITALS — BP 106/76 | HR 100 | Resp 18 | Wt 194.6 lb

## 2016-12-06 VITALS — BP 106/76 | HR 100 | Resp 18 | Ht 69.0 in | Wt 194.6 lb

## 2016-12-06 DIAGNOSIS — R180 Malignant ascites: Secondary | ICD-10-CM | POA: Diagnosis not present

## 2016-12-06 DIAGNOSIS — R109 Unspecified abdominal pain: Secondary | ICD-10-CM

## 2016-12-06 DIAGNOSIS — R609 Edema, unspecified: Secondary | ICD-10-CM

## 2016-12-06 DIAGNOSIS — C801 Malignant (primary) neoplasm, unspecified: Secondary | ICD-10-CM

## 2016-12-06 DIAGNOSIS — C7951 Secondary malignant neoplasm of bone: Secondary | ICD-10-CM | POA: Diagnosis not present

## 2016-12-06 DIAGNOSIS — R1110 Vomiting, unspecified: Secondary | ICD-10-CM

## 2016-12-06 DIAGNOSIS — C799 Secondary malignant neoplasm of unspecified site: Secondary | ICD-10-CM

## 2016-12-06 DIAGNOSIS — R911 Solitary pulmonary nodule: Secondary | ICD-10-CM

## 2016-12-06 DIAGNOSIS — R188 Other ascites: Secondary | ICD-10-CM | POA: Diagnosis not present

## 2016-12-06 DIAGNOSIS — K311 Adult hypertrophic pyloric stenosis: Secondary | ICD-10-CM

## 2016-12-06 MED ORDER — SODIUM CHLORIDE 0.9 % IV SOLN: 1000.0000 mL | Freq: Once | INTRAVENOUS | Status: DC

## 2016-12-06 NOTE — Progress Notes (Signed)
  [place] OFFICE PROGRESS NOTE   Diagnosis: Metastatic carcinoma  INTERVAL HISTORY:   Mr. Schneiders underwent placement of a pyloric stent at [facility] 11/24/2016. He continues to have intermittent vomiting. He denies nausea. He reports the vomiting has improved following the stent placement, but he had a large episode of emesis last night. He was admitted at [facility] 11/29/2016. A plain x-ray revealed no evidence of obstruction. He did not vomit while in the hospital and was discharged 12/01/2016. He was enrolled in home Hospice care at discharge. Reglan was continued at discharge. He is scheduled to follow-up with Dr. Truman Hayward next week.  Mr. Kravets presents today with his family. They report he has increased abdominal and leg swelling. He continues to have intermittent emesis. He denies pain.   Objective:  Vital signs in last 24 hours:  Blood pressure 106/76, pulse 100, resp. rate 18, weight 194 lb 9.6 oz (88.3 kg), SpO2 99 %.    HEENT: No thrush or ulcers Resp: Decreased breath sounds at the right lower chest, no respiratory distress Cardio: Regular rate and rhythm GI: Distended, no mass, nontender Vascular: Pitting edema of the legs bilaterally, edema of the scrotum  Skin: Diminished skin turgor    Medications: I have reviewed the patient's current medications.  Assessment/Plan: 1. Metastatic carcinoma ?  cytology from a paracentesis confirmed metastatic carcinoma ? Upper endoscopy 10/11/2016-pyloric ulcer, biopsy negative for malignancy ? PET scan [id number]- hypermetabolicright lower lobe nodule, hypermetabolism at the proximal duodenum and adjacent soft tissue, dilated stomach, hypermetabolic omental nodularity, hypermetabolic rib and iliac lesions 2. intermittent vomiting secondary to gastric outlet obstruction  Upper endoscopy at [facility] 11/24/2016-malignant-appearing stricture noted at the pylorus, stented, normal mucosa at the second portion of the duodenum  3.  Weight loss  4. Lower extremity edema secondary to intra-abdominadhypoalbuminemia   Disposition:  Mr. Brouhard has metastatic carcinoma, most likely from an upper GI source. There is evidence of carcinomatosis and bone metastases.  He continues to have intermittent emesis despite placement of the pyloric stent. The emesis may be related to persistent mechanical obstruction or gastroparesis. Treatment options appear limited. He does not appear to be a surgical candidate. We could consider a palliative gastrostomy tube.  He will continue fluids and liquid nutrition supplements as tolerated. We referred him for a palliative paracentesis.  He is scheduled for a follow-up pulmonary [facility] next week. He will continue home care with the [facility]. Mr. Mcbeth will return for an office visit in 2 weeks. I am available to see him sooner as needed.    Betsy Coder, MD  12/06/2016  11:29 AM

## 2016-12-06 NOTE — Progress Notes (Signed)
Dr. Benay Spice made aware of pt having no bowel sounds.   Pt to go to [facility] for paracentesis @ 1:30 pm per Dr. Benay Spice. No IV Fluids today. Pt to continue with Hospice care.

## 2016-12-06 NOTE — Progress Notes (Signed)
Patient agreeable to US paracentesis today at [facility] at 1:30PM per order of Dr. Benay Spice.

## 2016-12-06 NOTE — Progress Notes (Signed)
Paracentesis complete no signs of distress. 4.5L yellow colored ascites removed.

## 2016-12-06 NOTE — Procedures (Signed)
PreOperative Dx: Metastatic cancer, ascites Postoperative Dx: Metastatic cancer, ascites Procedure:   US guided paracentesis Radiologist:  Thornton Papas Anesthesia:  10 ml of1% lidocaine Specimen:  4.3 L of slightly cloudy yellow ascitic fluid EBL:   < 1 ml Complications: None

## 2016-12-06 NOTE — Patient Instructions (Signed)
Dehydration, Adult Dehydration is a condition in which there is not enough fluid or water in the body. This happens when you lose more fluids than you take in. Important organs, such as the kidneys, brain, and heart, cannot function without a proper amount of fluids. Any loss of fluids from the body can lead to dehydration. Dehydration can range from mild to severe. This condition should be treated right away to prevent it from becoming severe. What are the causes? This condition may be caused by:  Vomiting.  Diarrhea.  Excessive sweating, such as from heat exposure or exercise.  Not drinking enough fluid, especially:  When ill.  While doing activity that requires a lot of energy.  Excessive urination.  Fever.  Infection.  Certain medicines, such as medicines that cause the body to lose excess fluid (diuretics).  Inability to access safe drinking water.  Reduced physical ability to get adequate water and food. What increases the risk? This condition is more likely to develop in people:  Who have a poorly controlled long-term (chronic) illness, such as diabetes, heart disease, or kidney disease.  Who are age 65 or older.  Who are disabled.  Who live in a place with high altitude.  Who play endurance sports. What are the signs or symptoms? Symptoms of mild dehydration may include:   Thirst.  Dry lips.  Slightly dry mouth.  Dry, warm skin.  Dizziness. Symptoms of moderate dehydration may include:   Very dry mouth.  Muscle cramps.  Dark urine. Urine may be the color of tea.  Decreased urine production.  Decreased tear production.  Heartbeat that is irregular or faster than normal (palpitations).  Headache.  Light-headedness, especially when you stand up from a sitting position.  Fainting (syncope). Symptoms of severe dehydration may include:   Changes in skin, such as:  Cold and clammy skin.  Blotchy (mottled) or pale skin.  Skin that does  not quickly return to normal after being lightly pinched and released (poor skin turgor).  Changes in body fluids, such as:  Extreme thirst.  No tear production.  Inability to sweat when body temperature is high, such as in hot weather.  Very little urine production.  Changes in vital signs, such as:  Weak pulse.  Pulse that is more than 100 beats a minute when sitting still.  Rapid breathing.  Low blood pressure.  Other changes, such as:  Sunken eyes.  Cold hands and feet.  Confusion.  Lack of energy (lethargy).  Difficulty waking up from sleep.  Short-term weight loss.  Unconsciousness. How is this diagnosed? This condition is diagnosed based on your symptoms and a physical exam. Blood and urine tests may be done to help confirm the diagnosis. How is this treated? Treatment for this condition depends on the severity. Mild or moderate dehydration can often be treated at home. Treatment should be started right away. Do not wait until dehydration becomes severe. Severe dehydration is an emergency and it needs to be treated in a hospital. Treatment for mild dehydration may include:   Drinking more fluids.  Replacing salts and minerals in your blood (electrolytes) that you may have lost. Treatment for moderate dehydration may include:   Drinking an oral rehydration solution (ORS). This is a drink that helps you replace fluids and electrolytes (rehydrate). It can be found at pharmacies and retail stores. Treatment for severe dehydration may include:   Receiving fluids through an IV tube.  Receiving an electrolyte solution through a feeding tube that is   passed through your nose and into your stomach (nasogastric tube, or NG tube).  Correcting any abnormalities in electrolytes.  Treating the underlying cause of dehydration. Follow these instructions at home:  If directed by your health care provider, drink an ORS:  Make an ORS by following instructions on the  package.  Start by drinking small amounts, about  cup (120 mL) every 5-10 minutes.  Slowly increase how much you drink until you have taken the amount recommended by your health care provider.  Drink enough clear fluid to keep your urine clear or pale yellow. If you were told to drink an ORS, finish the ORS first, then start slowly drinking other clear fluids. Drink fluids such as:  Water. Do not drink only water. Doing that can lead to having too little salt (sodium) in the body (hyponatremia).  Ice chips.  Fruit juice that you have added water to (diluted fruit juice).  Low-calorie sports drinks.  Avoid:  Alcohol.  Drinks that contain a lot of sugar. These include high-calorie sports drinks, fruit juice that is not diluted, and soda.  Caffeine.  Foods that are greasy or contain a lot of fat or sugar.  Take over-the-counter and prescription medicines only as told by your health care provider.  Do not take sodium tablets. This can lead to having too much sodium in the body (hypernatremia).  Eat foods that contain a healthy balance of electrolytes, such as bananas, oranges, potatoes, tomatoes, and spinach.  Keep all follow-up visits as told by your health care provider. This is important. Contact a health care provider if:  You have abdominal pain that:  Gets worse.  Stays in one area (localizes).  You have a rash.  You have a stiff neck.  You are more irritable than usual.  You are sleepier or more difficult to wake up than usual.  You feel weak or dizzy.  You feel very thirsty.  You have urinated only a small amount of very dark urine over 6-8 hours. Get help right away if:  You have symptoms of severe dehydration.  You cannot drink fluids without vomiting.  Your symptoms get worse with treatment.  You have a fever.  You have a severe headache.  You have vomiting or diarrhea that:  Gets worse.  Does not go away.  You have blood or green matter  (bile) in your vomit.  You have blood in your stool. This may cause stool to look black and tarry.  You have not urinated in 6-8 hours.  You faint.  Your heart rate while sitting still is over 100 beats a minute.  You have trouble breathing. This information is not intended to replace advice given to you by your health care provider. Make sure you discuss any questions you have with your health care provider. Document Released: 09/25/2005 Document Revised: 04/21/2016 Document Reviewed: 11/19/2015 Elsevier Interactive Patient Education  2017 [employer]  

## 2016-12-07 ENCOUNTER — Telehealth: Payer: Self-pay | Admitting: Oncology

## 2016-12-07 DIAGNOSIS — Z515 Encounter for palliative care: Secondary | ICD-10-CM | POA: Diagnosis not present

## 2016-12-07 DIAGNOSIS — I2510 Atherosclerotic heart disease of native coronary artery without angina pectoris: Secondary | ICD-10-CM | POA: Diagnosis not present

## 2016-12-07 DIAGNOSIS — C7951 Secondary malignant neoplasm of bone: Secondary | ICD-10-CM | POA: Diagnosis not present

## 2016-12-07 DIAGNOSIS — R180 Malignant ascites: Secondary | ICD-10-CM | POA: Diagnosis not present

## 2016-12-07 DIAGNOSIS — C786 Secondary malignant neoplasm of retroperitoneum and peritoneum: Secondary | ICD-10-CM | POA: Diagnosis not present

## 2016-12-07 DIAGNOSIS — R112 Nausea with vomiting, unspecified: Secondary | ICD-10-CM | POA: Diagnosis not present

## 2016-12-07 DIAGNOSIS — C801 Malignant (primary) neoplasm, unspecified: Secondary | ICD-10-CM | POA: Diagnosis not present

## 2016-12-07 DIAGNOSIS — K219 Gastro-esophageal reflux disease without esophagitis: Secondary | ICD-10-CM | POA: Diagnosis not present

## 2016-12-07 DIAGNOSIS — N183 Chronic kidney disease, stage 3 (moderate): Secondary | ICD-10-CM | POA: Diagnosis not present

## 2016-12-07 NOTE — Telephone Encounter (Signed)
Called to inform patient of next scheduled appointments.

## 2016-12-08 DIAGNOSIS — N183 Chronic kidney disease, stage 3 (moderate): Secondary | ICD-10-CM | POA: Diagnosis not present

## 2016-12-08 DIAGNOSIS — R180 Malignant ascites: Secondary | ICD-10-CM | POA: Diagnosis not present

## 2016-12-08 DIAGNOSIS — I2510 Atherosclerotic heart disease of native coronary artery without angina pectoris: Secondary | ICD-10-CM | POA: Diagnosis not present

## 2016-12-08 DIAGNOSIS — C801 Malignant (primary) neoplasm, unspecified: Secondary | ICD-10-CM | POA: Diagnosis not present

## 2016-12-08 DIAGNOSIS — C7951 Secondary malignant neoplasm of bone: Secondary | ICD-10-CM | POA: Diagnosis not present

## 2016-12-08 DIAGNOSIS — C786 Secondary malignant neoplasm of retroperitoneum and peritoneum: Secondary | ICD-10-CM | POA: Diagnosis not present

## 2016-12-10 DIAGNOSIS — I2510 Atherosclerotic heart disease of native coronary artery without angina pectoris: Secondary | ICD-10-CM | POA: Diagnosis not present

## 2016-12-10 DIAGNOSIS — C786 Secondary malignant neoplasm of retroperitoneum and peritoneum: Secondary | ICD-10-CM | POA: Diagnosis not present

## 2016-12-10 DIAGNOSIS — C7951 Secondary malignant neoplasm of bone: Secondary | ICD-10-CM | POA: Diagnosis not present

## 2016-12-10 DIAGNOSIS — C801 Malignant (primary) neoplasm, unspecified: Secondary | ICD-10-CM | POA: Diagnosis not present

## 2016-12-10 DIAGNOSIS — N183 Chronic kidney disease, stage 3 (moderate): Secondary | ICD-10-CM | POA: Diagnosis not present

## 2016-12-10 DIAGNOSIS — R180 Malignant ascites: Secondary | ICD-10-CM | POA: Diagnosis not present

## 2016-12-11 ENCOUNTER — Ambulatory Visit (HOSPITAL_COMMUNITY)
Admission: RE | Admit: 2016-12-11 | Discharge: 2016-12-11 | Disposition: A | Discharge status: Home / Self Care | Payer: Medicare Other | Source: Ambulatory Visit | Attending: Oncology | Admitting: Oncology

## 2016-12-11 ENCOUNTER — Telehealth: Payer: Self-pay | Admitting: *Deleted

## 2016-12-11 ENCOUNTER — Other Ambulatory Visit: Payer: Self-pay | Admitting: *Deleted

## 2016-12-11 ENCOUNTER — Encounter (HOSPITAL_COMMUNITY): Payer: Self-pay

## 2016-12-11 ENCOUNTER — Encounter: Payer: Self-pay | Admitting: Oncology

## 2016-12-11 DIAGNOSIS — C7951 Secondary malignant neoplasm of bone: Secondary | ICD-10-CM | POA: Diagnosis not present

## 2016-12-11 DIAGNOSIS — R180 Malignant ascites: Secondary | ICD-10-CM | POA: Diagnosis not present

## 2016-12-11 DIAGNOSIS — I2510 Atherosclerotic heart disease of native coronary artery without angina pectoris: Secondary | ICD-10-CM | POA: Diagnosis not present

## 2016-12-11 DIAGNOSIS — C799 Secondary malignant neoplasm of unspecified site: Secondary | ICD-10-CM

## 2016-12-11 DIAGNOSIS — N183 Chronic kidney disease, stage 3 (moderate): Secondary | ICD-10-CM | POA: Diagnosis not present

## 2016-12-11 DIAGNOSIS — K311 Adult hypertrophic pyloric stenosis: Secondary | ICD-10-CM | POA: Diagnosis not present

## 2016-12-11 DIAGNOSIS — C801 Malignant (primary) neoplasm, unspecified: Secondary | ICD-10-CM | POA: Diagnosis not present

## 2016-12-11 DIAGNOSIS — C786 Secondary malignant neoplasm of retroperitoneum and peritoneum: Secondary | ICD-10-CM | POA: Diagnosis not present

## 2016-12-11 DIAGNOSIS — R188 Other ascites: Secondary | ICD-10-CM | POA: Diagnosis not present

## 2016-12-11 NOTE — Telephone Encounter (Signed)
Message left for patient's son, Timmothy Sours to come to [facility] now for paracentesis per order of central scheduling.  Instructed Don to call [facility] back with any questions or concerns.

## 2016-12-11 NOTE — Progress Notes (Signed)
Paracentesis complete no signs of distress. 1.8L bright yellow colored ascites removed.

## 2016-12-11 NOTE — Progress Notes (Signed)
Call received from patient's son, Timmothy Sours to triage requesting paracentesis today at [facility].  Per Dr. Benay Spice, [place] for paracentesis today, patient's son notified and order placed.

## 2016-12-12 DIAGNOSIS — R180 Malignant ascites: Secondary | ICD-10-CM | POA: Diagnosis not present

## 2016-12-12 DIAGNOSIS — C786 Secondary malignant neoplasm of retroperitoneum and peritoneum: Secondary | ICD-10-CM | POA: Diagnosis not present

## 2016-12-12 DIAGNOSIS — C801 Malignant (primary) neoplasm, unspecified: Secondary | ICD-10-CM | POA: Diagnosis not present

## 2016-12-12 DIAGNOSIS — N183 Chronic kidney disease, stage 3 (moderate): Secondary | ICD-10-CM | POA: Diagnosis not present

## 2016-12-12 DIAGNOSIS — I2510 Atherosclerotic heart disease of native coronary artery without angina pectoris: Secondary | ICD-10-CM | POA: Diagnosis not present

## 2016-12-12 DIAGNOSIS — C7951 Secondary malignant neoplasm of bone: Secondary | ICD-10-CM | POA: Diagnosis not present

## 2016-12-13 DIAGNOSIS — C801 Malignant (primary) neoplasm, unspecified: Secondary | ICD-10-CM | POA: Diagnosis not present

## 2016-12-13 DIAGNOSIS — I2510 Atherosclerotic heart disease of native coronary artery without angina pectoris: Secondary | ICD-10-CM | POA: Diagnosis not present

## 2016-12-13 DIAGNOSIS — C786 Secondary malignant neoplasm of retroperitoneum and peritoneum: Secondary | ICD-10-CM | POA: Diagnosis not present

## 2016-12-13 DIAGNOSIS — C7951 Secondary malignant neoplasm of bone: Secondary | ICD-10-CM | POA: Diagnosis not present

## 2016-12-13 DIAGNOSIS — R180 Malignant ascites: Secondary | ICD-10-CM | POA: Diagnosis not present

## 2016-12-13 DIAGNOSIS — N183 Chronic kidney disease, stage 3 (moderate): Secondary | ICD-10-CM | POA: Diagnosis not present

## 2016-12-14 ENCOUNTER — Telehealth: Payer: Self-pay | Admitting: Oncology

## 2016-12-14 DIAGNOSIS — C801 Malignant (primary) neoplasm, unspecified: Secondary | ICD-10-CM | POA: Diagnosis not present

## 2016-12-14 DIAGNOSIS — C7951 Secondary malignant neoplasm of bone: Secondary | ICD-10-CM | POA: Diagnosis not present

## 2016-12-14 DIAGNOSIS — C786 Secondary malignant neoplasm of retroperitoneum and peritoneum: Secondary | ICD-10-CM | POA: Diagnosis not present

## 2016-12-14 DIAGNOSIS — I2510 Atherosclerotic heart disease of native coronary artery without angina pectoris: Secondary | ICD-10-CM | POA: Diagnosis not present

## 2016-12-14 DIAGNOSIS — R180 Malignant ascites: Secondary | ICD-10-CM | POA: Diagnosis not present

## 2016-12-14 DIAGNOSIS — N183 Chronic kidney disease, stage 3 (moderate): Secondary | ICD-10-CM | POA: Diagnosis not present

## 2016-12-21 ENCOUNTER — Ambulatory Visit: Payer: Medicare Other | Admitting: Nurse Practitioner

## 2016-12-21 ENCOUNTER — Telehealth: Payer: Self-pay | Admitting: Oncology

## 2016-12-21 ENCOUNTER — Other Ambulatory Visit: Payer: Medicare Other

## 2016-12-21 NOTE — Telephone Encounter (Signed)
Faxed Medical certificate to [facility] claims department fax [phone]

## 2016-12-30 ENCOUNTER — Other Ambulatory Visit: Payer: Self-pay | Admitting: Internal Medicine

## 2017-01-01 NOTE — Telephone Encounter (Signed)
I believe he is deceased Saw obituary

## 2017-01-01 NOTE — Telephone Encounter (Signed)
Please advise Sir, thank you. 

## 2017-01-01 NOTE — Telephone Encounter (Signed)
Medical records will be informed.

## 2017-01-07 NOTE — Telephone Encounter (Signed)
Received Travel Insurance forms for patient to be completed

## 2017-01-07 DEATH — deceased

## 2017-03-24 IMAGING — DX DG ABDOMEN 2V
3 series · 3 of 3 positions shown · non-contrast
Comparison: CT scan [DATE]

CLINICAL DATA: Vomiting for 10 days, constipation, diarrhea

EXAM:
ABDOMEN - 2 VIEW

[abdomen erect]
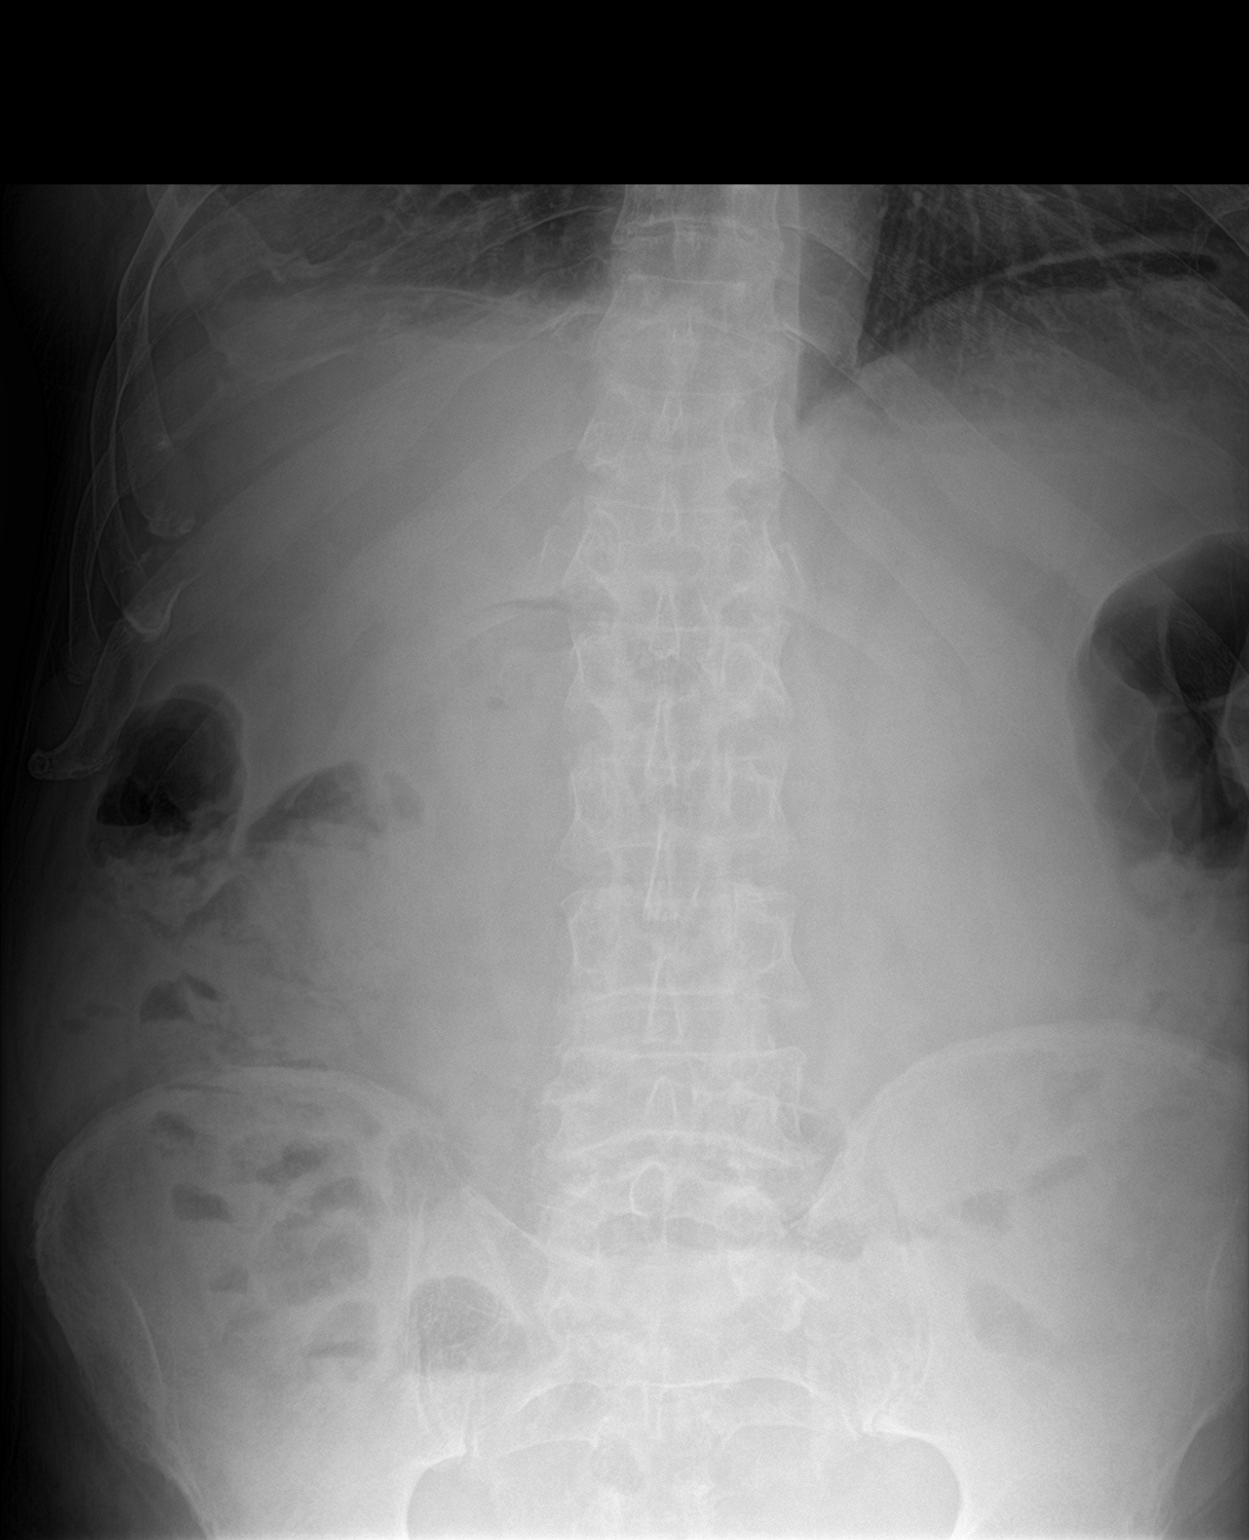

[abdomen supine (1 of 2)]
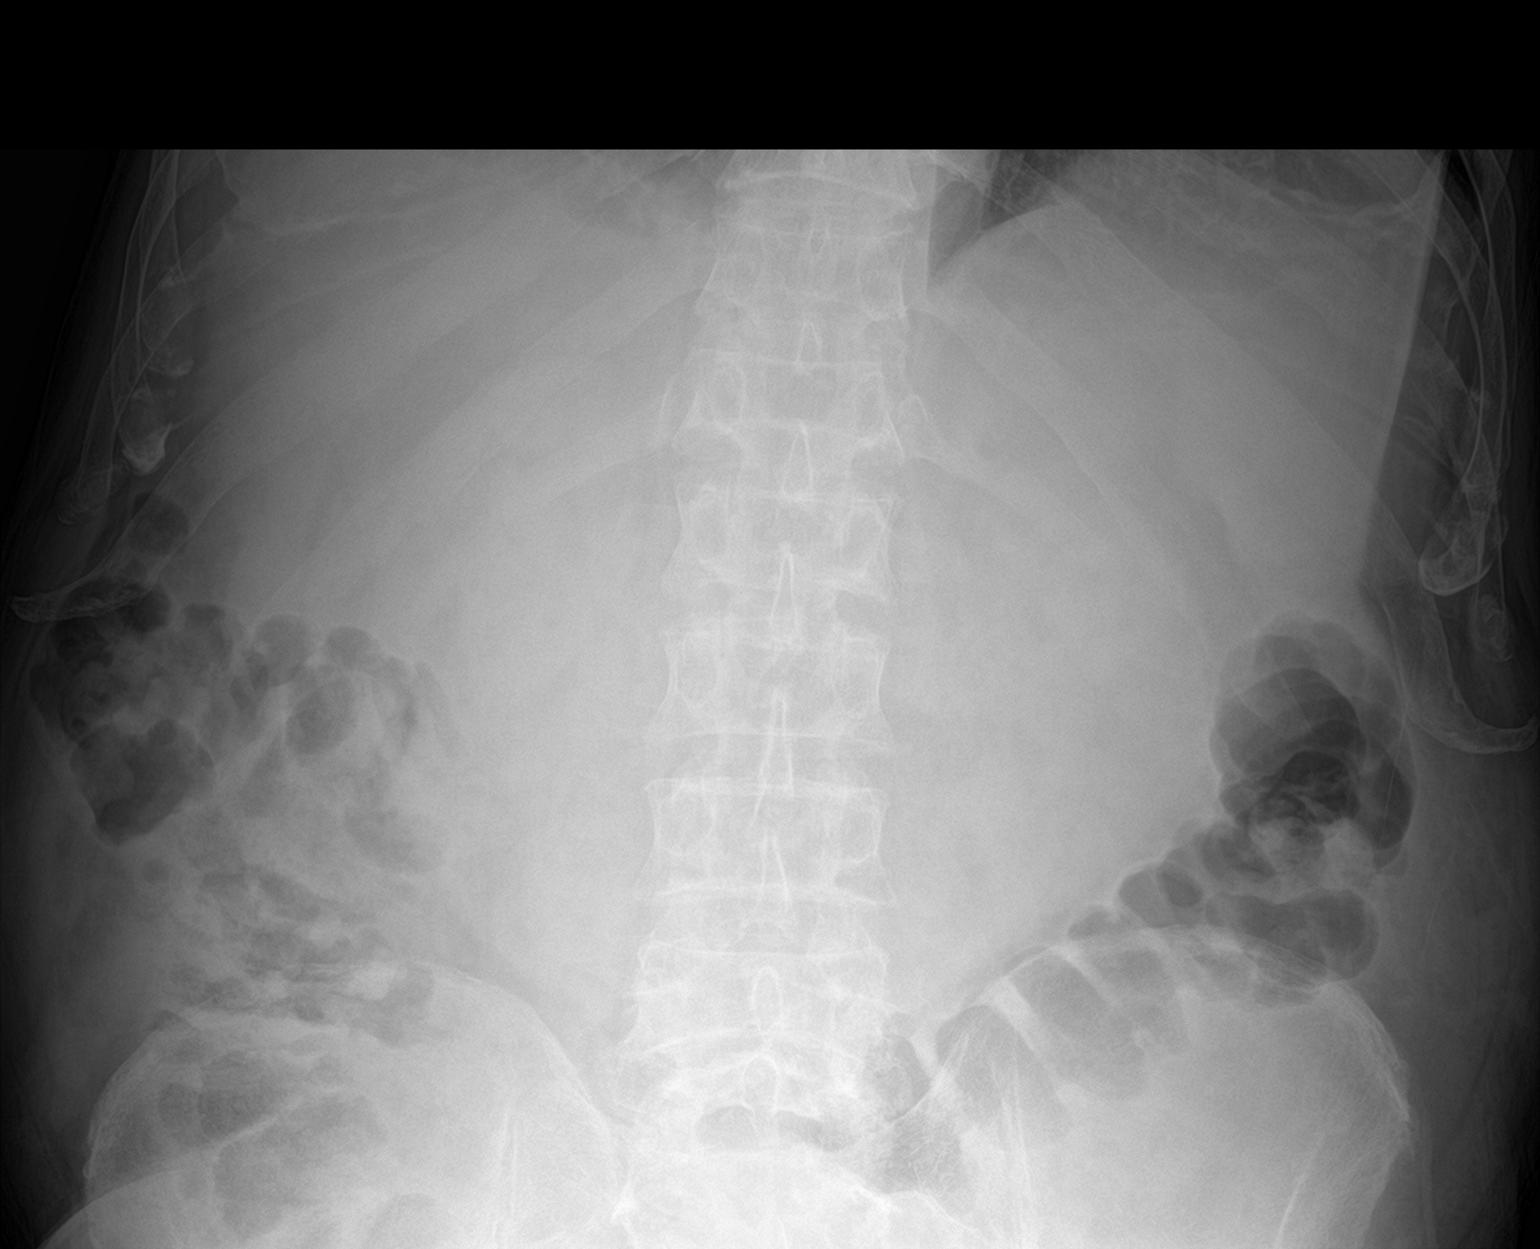

[abdomen supine (2 of 2)]
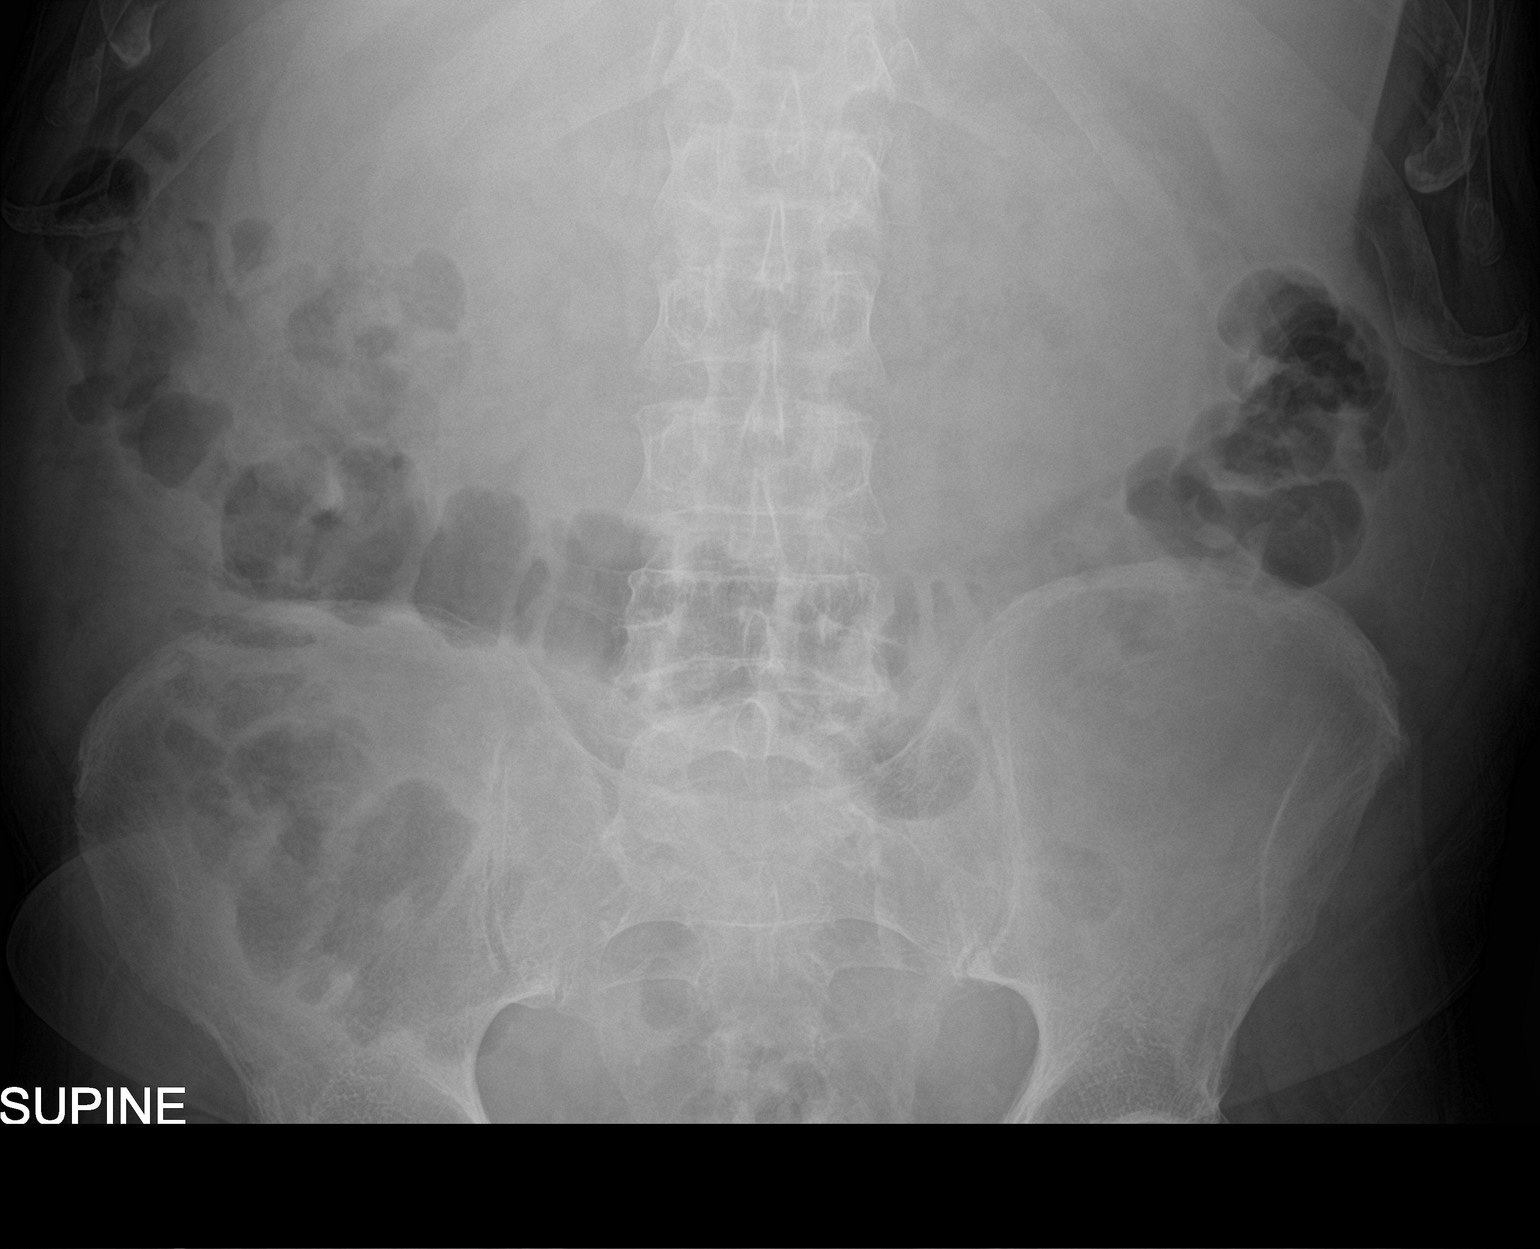

[3 of 3 positions shown; findings below may reference images not displayed]

FINDINGS: There is normal small bowel gas pattern. Significant gastric
distension with fluid. Gastroparesis or significant gastric ileus
cannot be excluded. Clinical correlation is necessary. Some colonic
gas noted in right colon and transverse colon. No free abdominal
air.
IMPRESSION: Normal small bowel gas pattern. Significant gastric distension with
fluid. Gastroparesis or significant gastric ileus cannot be excluded
clinical correlation is necessary. No evidence of free abdominal
air.

## 2017-04-15 IMAGING — US CT ABD-PELV W/O CM
1 series · 2 of 2 positions shown · non-contrast
Comparison: PET-CT - 10/25/2016; CT the chest, abdomen pelvis -
10/11/2016 CT abdomen and pelvis - 09/28/2016.

CLINICAL DATA: No known primary, now with ascites and
hypermetabolic peritoneal nodule. Please perform CT-guided biopsy
for tissue diagnostic purposes.

EXAM:
1. CT ABDOMEN AND PELVIS WITHOUT CONTRAST
2. ULTRASOUND AND CT-GUIDED PARACENTESIS
TECHNIQUE: Multidetector CT imaging of the abdomen and pelvis was performed
following the standard protocol without IV contrast.

[Series 1: ct abd-pelv w/o cm · 2 of 2 slices shown]
[im 1/2]
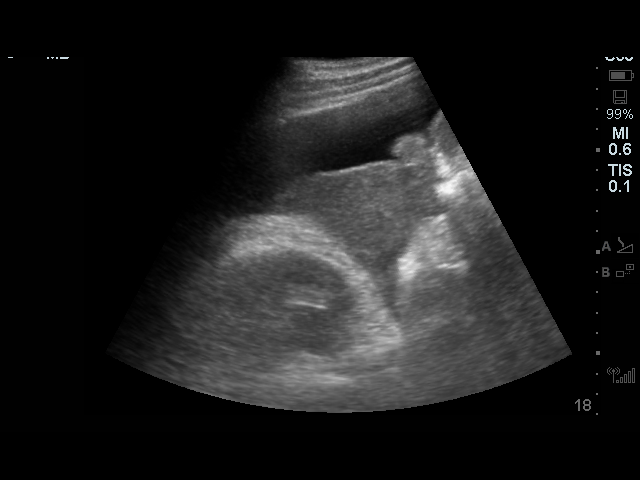
[im 2/2]
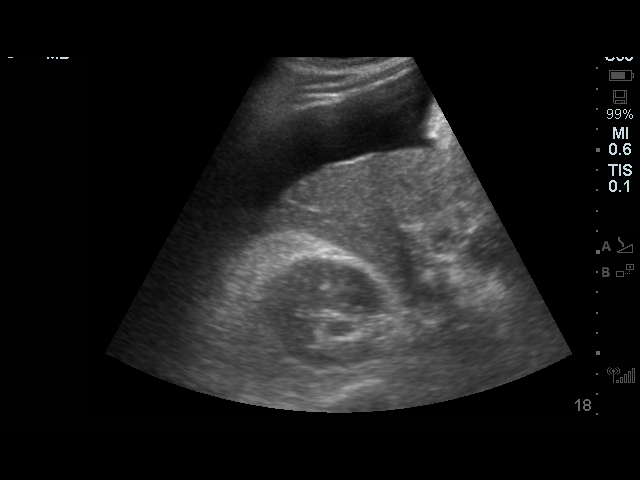

[2 of 2 positions shown; findings below may reference images not displayed]

FINDINGS: Lower chest: Limited visualization of the lower thorax demonstrates
interval increase in size of now a small right-sided pleural
effusion with worsening right basilar opacities, likely atelectasis.
Known 9 mm right lower lobe pulmonary nodule is not imaged in the
present examination.

Normal heart size. Trace amount of pericardial fluid, presumably
physiologic. Coronary artery calcifications. There is diffuse
decreased attenuation of the intra cardiac blood pool suggestive of
anemia.

Hepatobiliary: The liver is slightly shrunken appearance with mild
nodularity of the hepatic contour. Note is made of a punctate
granuloma within the central aspect of the anterior segment of the
right lobe of the liver (image 18, series 3). The gallbladder is not
seen.

Interval increase in now moderate volume intra- abdominal ascites.

Previously noted hypermetabolic apparent nodular implant along the
ventral aspect of the right mid abdomen is no longer visualized
however there is fluid tracking within the root of the mesentery and
rather extensive mesenteric venous congestion.

Pancreas: Normal noncontrast appearance of the pancreas.

Spleen: Normal noncontrast appearance of the spleen.

Adrenals/Urinary Tract: Re- demonstrated mild bilateral
ureterectasis and pelvicaliectasis, similar to recent PET-CT scan,
though again the etiology is not definitely demonstrated on the
present examination does apparent tethering involving in the
mid/distal aspects of the bilateral ureter as could be seen in the
setting of retroperitoneal fibrosis.

Read re- demonstrated diffuse thickening the urinary bladder wall,
potentially secondary to underdistention.

Normal noncontrast appearance the bilateral adrenal glands.

Stomach/Bowel: Re- demonstrated apparent narrowing involving the
gastric antrum extending to involve the proximal duodenum (image 44,
series 3) moderate colonic stool burden without evidence of enteric
obstruction. Scattered colonic diverticulosis without evidence of
diverticulitis. Radiopaque pill fragments are seen within the
transverse colon. No pneumoperitoneum, pneumatosis or portal venous
gas.

Vascular/Lymphatic: Large amount of irregular mixed calcified and
noncalcified atherosclerotic plaque throughout the abdominal aorta.

Linear intraluminal calcification within the infrarenal abdominal
aorta is unchanged and favored to represent a contained penetrating
atherosclerotic ulcer. The abdominal aorta is mildly ectatic at this
location measuring 2.8 cm in diameter.

Scattered retroperitoneal lymph nodes are numerous though
individually not enlarged by size criteria with index left-sided
periaortic lymph node measuring 0.9 cm in greatest short axis
diameter (60, series 3). No definitive bulky mediastinal, hilar
axillary lymphadenopathy on this noncontrast examination.

Reproductive: Dystrophic calcifications within normal sized prostate
gland. Small amount of free fluid in the pelvic cul-de-sac.

Other: Mild diffuse body wall anasarca.

Musculoskeletal: Unchanged punctate (approximately 1.2 cm) sclerotic
lesion within the right ilium without associated periostitis.

____________________________________________________________

Resolution of the hypermetabolic peritoneal nodule with discussed
with referring gastroenterologist, Dr. Nordgaard, and the decision
ways made to precede with diagnostic and therapeutic paracentesis.

Informed written consent was obtained from the patient after a
discussion of the risks, benefits and alternatives to treatment. A
timeout was performed prior to the initiation of the procedure.

Initial ultrasound scanning demonstrates a moderate amount of
ascites within the right mid hemiabdomen. The right mid abdomen was
prepped and draped in the usual sterile fashion. 1% lidocaine with
epinephrine was used for local anesthesia. An ultrasound image was
saved for documentation purposed. An 8 Fr Safe-T-Centesis catheter
was introduced. The paracentesis was performed. The catheter was
removed and a dressing was applied.

The patient tolerated the procedure well without immediate post
procedural complication.
IMPRESSION: 1. Previously noted hypermetabolic apparent peritoneal/omental
implant within the ventral aspect of the right mid abdomen is no
longer visualized. This was discussed with referring
gastroenterologist, Dr. Nordgaard, and the decision was made to
proceed with diagnostic and therapeutic paracentesis.
2. Successful CT and ultrasound-guided paracentesis yielding 2.4 L
of serous ascitic fluid. All samples were sent to the laboratory for
cytologic analysis.
3. Interval increase in size of small right-sided effusion.
4. Grossly unchanged mild-to-moderate bilateral ureterectasis and
pelvicaliectasis, the etiology of which is not depicted on this
examination though there is apparent tethering of the mid/distal
aspects of the bilateral ureters, nonspecific though could be seen
in the setting of retroperitoneal fibrosis. Additionally, there is
mild thickening of the urinary bladder wall, nonspecific though
could be seen in the setting of cystitis. Further evaluation with
urologic consultation and potential cystoscopy could be performed as
clinically indicated.
5. Aortic Atherosclerosis ([id number]-[id number]). Coronary artery
calcifications.
6. Grossly unchanged appearance of suspected penetrating
atherosclerotic ulcer with the mid aspect the abdominal aorta with
focal abdominal aortic ectasia at this location measuring
approximately 2.8 cm.

## 2017-04-24 IMAGING — US US ABDOMEN LIMITED
1 series · 8 of 8 positions shown · non-contrast
Comparison: Abdominal CT 11/08/2016

CLINICAL DATA: Malignant ascites

EXAM:
LIMITED ABDOMEN ULTRASOUND FOR ASCITES
TECHNIQUE: Limited ultrasound survey for ascites was performed in all four
abdominal quadrants.

[Series 1: us abdomen limited · 0.28mm/px · 8 of 8 slices shown]
[im 1/8]
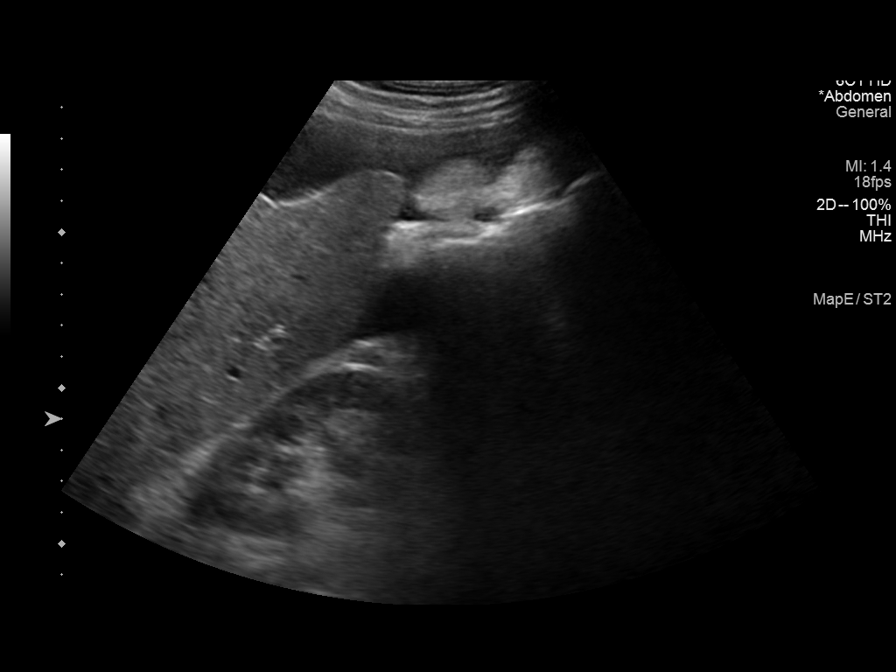
[im 2/8]
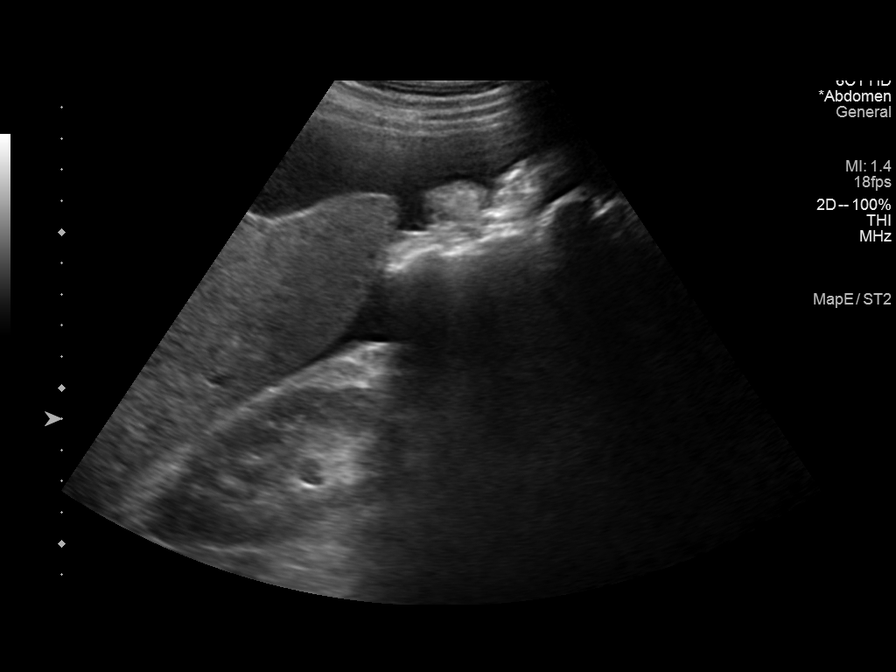
[im 3/8]
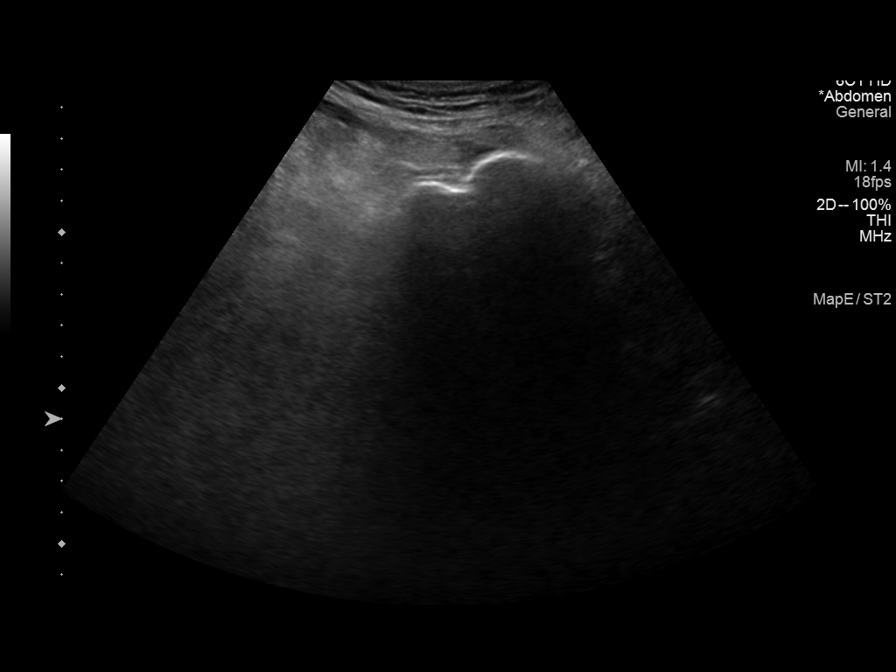
[im 4/8]
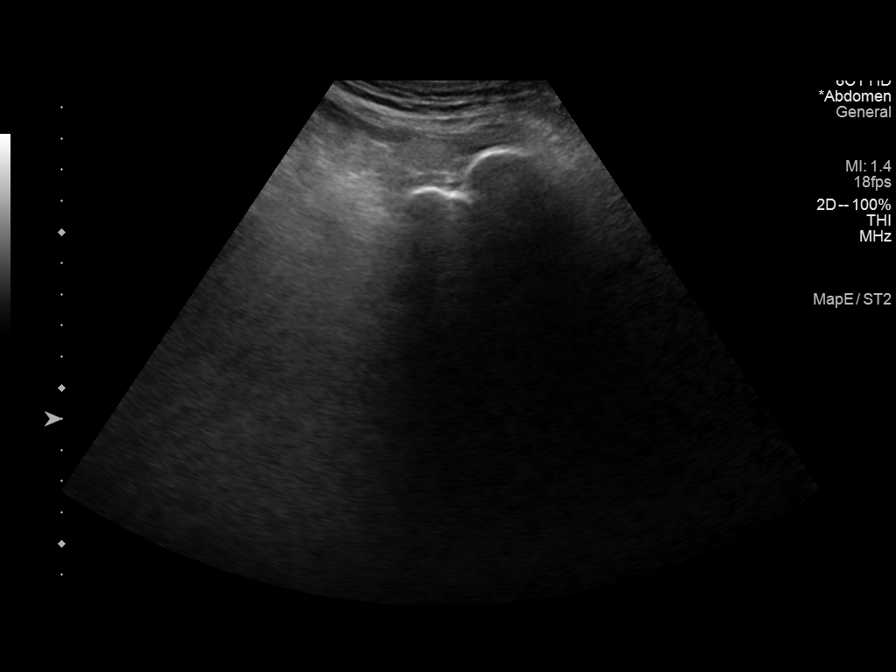
[im 5/8]
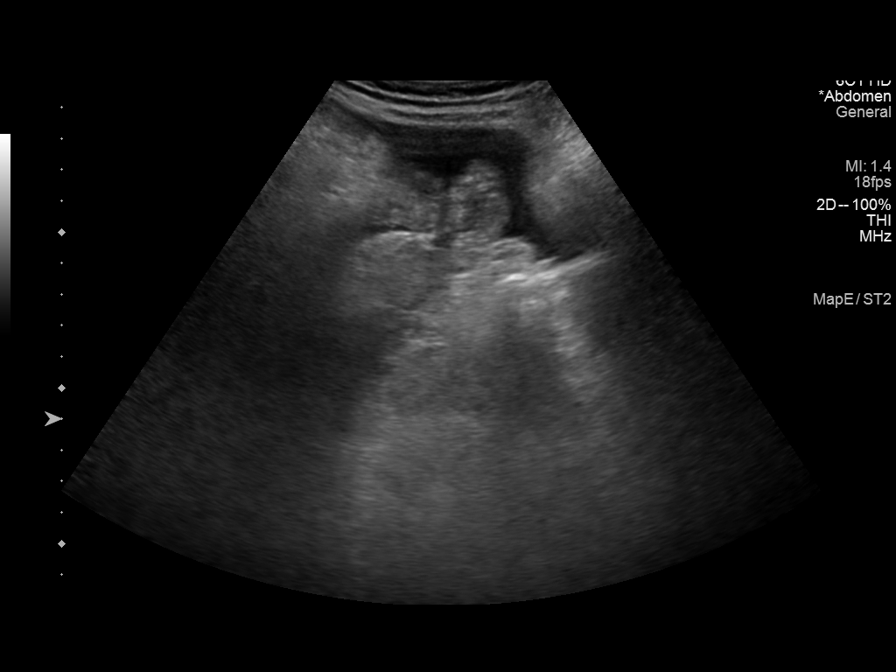
[im 6/8]
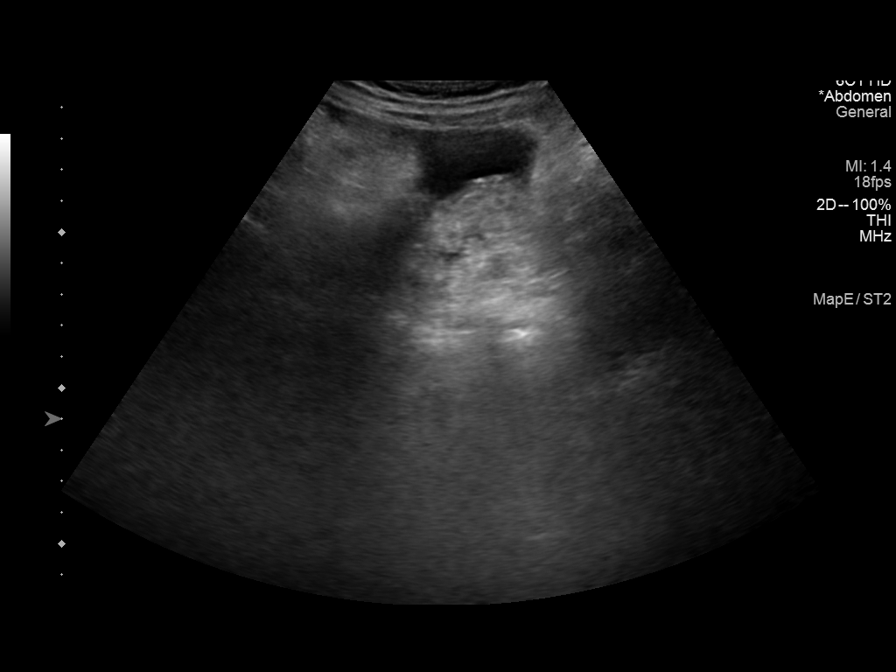
[im 7/8]
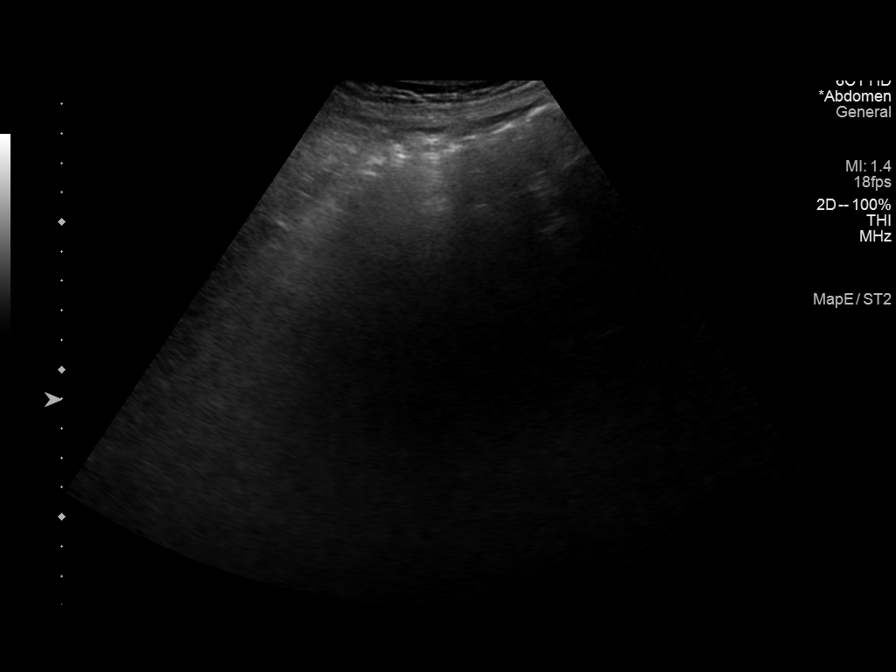
[im 8/8]
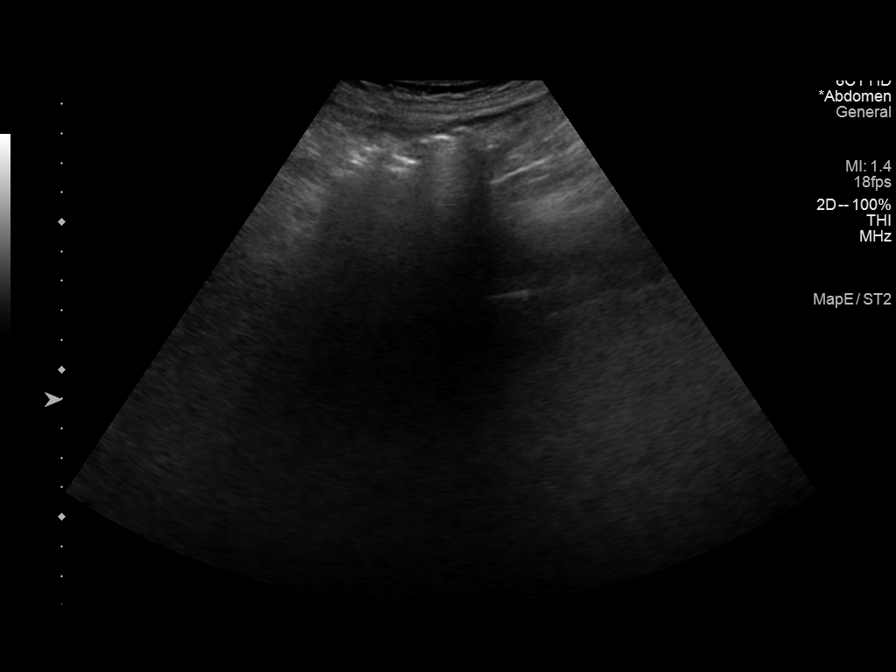

[8 of 8 positions shown; findings below may reference images not displayed]

FINDINGS: Ascites mainly in the upper abdomen which is similar volume to
11/08/2016 exam. Maximal pocket measures 26 mm in depth, located
over the right liver. No internal echoes noted.
IMPRESSION: Small volume ascites that is similar to 11/08/2016 imaging.
